# Patient Record
Sex: Male | Born: 1990 | Race: Black or African American | Hispanic: No | Marital: Married | Smoking: Never smoker
Health system: Southern US, Community
[De-identification: ages and names within clinical notes are randomized; demographics above are authoritative.]

## PROBLEM LIST (undated history)

## (undated) DIAGNOSIS — J45909 Unspecified asthma, uncomplicated: Secondary | ICD-10-CM

## (undated) DIAGNOSIS — I10 Essential (primary) hypertension: Secondary | ICD-10-CM

## (undated) DIAGNOSIS — L309 Dermatitis, unspecified: Secondary | ICD-10-CM

---
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
## 2010-05-15 ENCOUNTER — Emergency Department (HOSPITAL_COMMUNITY)
Admission: EM | Admit: 2010-05-15 | Discharge: 2010-05-15 | Payer: Self-pay | Source: Home / Self Care | Admitting: Emergency Medicine

## 2010-08-07 LAB — CBC
HCT: 45 % (ref 39.0–52.0)
Hemoglobin: 16.4 g/dL (ref 13.0–17.0)
MCH: 29.8 pg (ref 26.0–34.0)
MCHC: 36.4 g/dL — ABNORMAL HIGH (ref 30.0–36.0)
MCV: 81.8 fL (ref 78.0–100.0)
Platelets: 176 10*3/uL (ref 150–400)
RBC: 5.5 MIL/uL (ref 4.22–5.81)
RDW: 11.7 % (ref 11.5–15.5)
WBC: 5.3 10*3/uL (ref 4.0–10.5)

## 2010-08-07 LAB — URINALYSIS, ROUTINE W REFLEX MICROSCOPIC
Bilirubin Urine: NEGATIVE
Glucose, UA: NEGATIVE mg/dL
Hgb urine dipstick: NEGATIVE
Ketones, ur: NEGATIVE mg/dL
Nitrite: NEGATIVE
Protein, ur: NEGATIVE mg/dL
Specific Gravity, Urine: 1.017 (ref 1.005–1.030)
Urobilinogen, UA: 0.2 mg/dL (ref 0.0–1.0)
pH: 7 (ref 5.0–8.0)

## 2010-08-07 LAB — BASIC METABOLIC PANEL
BUN: 5 mg/dL — ABNORMAL LOW (ref 6–23)
CO2: 30 mEq/L (ref 19–32)
Calcium: 9.6 mg/dL (ref 8.4–10.5)
Chloride: 104 mEq/L (ref 96–112)
Creatinine, Ser: 0.98 mg/dL (ref 0.4–1.5)
GFR calc Af Amer: >60 mL/min (ref >60)
GFR calc non Af Amer: >60 mL/min (ref >60)
Glucose, Bld: 89 mg/dL (ref 70–99)
Potassium: 4.1 mEq/L (ref 3.5–5.1)
Sodium: 140 mEq/L (ref 135–145)

## 2010-08-07 LAB — HEPATIC FUNCTION PANEL
ALT: 17 U/L (ref 0–53)
AST: 21 U/L (ref 0–37)
Albumin: 4 g/dL (ref 3.5–5.2)
Alkaline Phosphatase: 63 U/L (ref 39–117)
Bilirubin, Direct: 0.2 mg/dL (ref 0.0–0.3)
Indirect Bilirubin: 1 mg/dL — ABNORMAL HIGH (ref 0.3–0.9)
Total Bilirubin: 1.2 mg/dL (ref 0.3–1.2)
Total Protein: 6.6 g/dL (ref 6.0–8.3)

## 2010-08-07 LAB — DIFFERENTIAL
Basophils Absolute: 0 10*3/uL (ref 0.0–0.1)
Basophils Relative: 0 % (ref 0–1)
Eosinophils Absolute: 0.1 10*3/uL (ref 0.0–0.7)
Eosinophils Relative: 2 % (ref 0–5)
Lymphocytes Relative: 22 % (ref 12–46)
Lymphs Abs: 1.2 10*3/uL (ref 0.7–4.0)
Monocytes Absolute: 0.4 10*3/uL (ref 0.1–1.0)
Monocytes Relative: 7 % (ref 3–12)
Neutro Abs: 3.7 10*3/uL (ref 1.7–7.7)
Neutrophils Relative %: 69 % (ref 43–77)

## 2011-03-20 ENCOUNTER — Emergency Department (HOSPITAL_COMMUNITY): Payer: Self-pay

## 2011-03-20 ENCOUNTER — Emergency Department (HOSPITAL_COMMUNITY)
Admission: EM | Admit: 2011-03-20 | Discharge: 2011-03-20 | Disposition: A | Discharge status: Home / Self Care | Payer: Self-pay | Attending: Emergency Medicine | Admitting: Emergency Medicine

## 2011-03-20 DIAGNOSIS — S60229A Contusion of unspecified hand, initial encounter: Secondary | ICD-10-CM | POA: Insufficient documentation

## 2011-03-20 DIAGNOSIS — M79609 Pain in unspecified limb: Secondary | ICD-10-CM | POA: Insufficient documentation

## 2011-03-20 DIAGNOSIS — IMO0002 Reserved for concepts with insufficient information to code with codable children: Secondary | ICD-10-CM | POA: Insufficient documentation

## 2012-06-03 ENCOUNTER — Emergency Department (HOSPITAL_COMMUNITY)
Admission: EM | Admit: 2012-06-03 | Discharge: 2012-06-03 | Disposition: A | Discharge status: Home / Self Care | Payer: Self-pay | Attending: Emergency Medicine | Admitting: Emergency Medicine

## 2012-06-03 ENCOUNTER — Encounter (HOSPITAL_COMMUNITY): Payer: Self-pay | Admitting: Emergency Medicine

## 2012-06-03 DIAGNOSIS — S71009A Unspecified open wound, unspecified hip, initial encounter: Secondary | ICD-10-CM | POA: Insufficient documentation

## 2012-06-03 DIAGNOSIS — J45909 Unspecified asthma, uncomplicated: Secondary | ICD-10-CM | POA: Insufficient documentation

## 2012-06-03 DIAGNOSIS — S71109A Unspecified open wound, unspecified thigh, initial encounter: Secondary | ICD-10-CM | POA: Insufficient documentation

## 2012-06-03 DIAGNOSIS — Z23 Encounter for immunization: Secondary | ICD-10-CM | POA: Insufficient documentation

## 2012-06-03 DIAGNOSIS — S71119A Laceration without foreign body, unspecified thigh, initial encounter: Secondary | ICD-10-CM

## 2012-06-03 DIAGNOSIS — W268XXA Contact with other sharp object(s), not elsewhere classified, initial encounter: Secondary | ICD-10-CM | POA: Insufficient documentation

## 2012-06-03 DIAGNOSIS — Y9389 Activity, other specified: Secondary | ICD-10-CM | POA: Insufficient documentation

## 2012-06-03 DIAGNOSIS — Y929 Unspecified place or not applicable: Secondary | ICD-10-CM | POA: Insufficient documentation

## 2012-06-03 HISTORY — DX: Unspecified asthma, uncomplicated: J45.909

## 2012-06-03 MED ORDER — TETANUS-DIPHTH-ACELL PERTUSSIS 5-2.5-18.5 LF-MCG/0.5 IM SUSP
0.5000 mL | Freq: Once | INTRAMUSCULAR | Status: AC
  Administered 2012-06-03: 0.5 mL via INTRAMUSCULAR
  Filled 2012-06-03: qty 0.5
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
## 2012-06-03 NOTE — ED Provider Notes (Signed)
Medical screening examination/treatment/procedure(s) were performed by non-physician practitioner and as supervising physician I was immediately available for consultation/collaboration.  Gerhard Munch, MD 06/03/12 2358

## 2012-06-03 NOTE — ED Provider Notes (Signed)
History     CSN: [id number]  Arrival date & time 06/03/12  4098   First MD Initiated Contact with Patient 06/03/12 1942      Chief Complaint  Patient presents with  . Extremity Laceration    (Consider location/radiation/quality/duration/timing/severity/associated sxs/prior treatment) Patient is a 22 y.o. male presenting with skin laceration. The history is provided by the patient. No language interpreter was used.  Laceration  The incident occurred yesterday. The laceration is located on the left leg. The laceration is 1 cm in size. The laceration mechanism was a a metal edge. The pain is at a severity of 0/10. The patient is experiencing no pain. He reports no foreign bodies present. His tetanus status is unknown.    Past Medical History  Diagnosis Date  . Asthma     History reviewed. No pertinent past surgical history.  History reviewed. No pertinent family history.  History  Substance Use Topics  . Smoking status: Never Smoker   . Smokeless tobacco: Not on file  . Alcohol Use: Yes     Comment: occ      Review of Systems  Skin: Positive for wound.  All other systems reviewed and are negative.    Allergies  Amoxicillin  Home Medications  No current outpatient prescriptions on file.  BP 95/62  Pulse 58  Temp 97.7 F (36.5 C) (Oral)  Resp 18  SpO2 100%  Physical Exam  Nursing note and vitals reviewed. Constitutional: He is oriented to person, place, and time. He appears well-developed and well-nourished.  HENT:  Head: Normocephalic and atraumatic.  Eyes: Conjunctivae normal are normal. Pupils are equal, round, and reactive to light.  Neck: Normal range of motion. Neck supple.  Cardiovascular: Normal rate and regular rhythm.   Pulmonary/Chest: Effort normal and breath sounds normal.  Abdominal: Soft. Bowel sounds are normal.  Musculoskeletal: Normal range of motion.  Lymphadenopathy:    He has no cervical adenopathy.  Neurological: He is alert and  oriented to person, place, and time.  Skin: Skin is warm and dry.     Psychiatric: He has a normal mood and affect. His behavior is normal. Judgment and thought content normal.    ED Course  Procedures (including critical care time)  Labs Reviewed - No data to display No results found.   No diagnosis found.   Laceration to thigh.  Over 24 hours old, superficial, no muscle or fascia involvement.  Will update tetanus, allow wound to heal by secondary intention. MDM          Jimmye Norman, NP 06/03/12 2312

## 2014-03-18 ENCOUNTER — Emergency Department (HOSPITAL_COMMUNITY)
Admission: EM | Admit: 2014-03-18 | Discharge: 2014-03-18 | Disposition: A | Discharge status: Home / Self Care | Payer: PRIVATE HEALTH INSURANCE | Attending: Emergency Medicine | Admitting: Emergency Medicine

## 2014-03-18 ENCOUNTER — Emergency Department (INDEPENDENT_AMBULATORY_CARE_PROVIDER_SITE_OTHER)
Admission: EM | Admit: 2014-03-18 | Discharge: 2014-03-18 | Disposition: A | Discharge status: Nursing Home (Includes ICF) | Payer: Self-pay | Source: Home / Self Care | Attending: Family Medicine | Admitting: Family Medicine

## 2014-03-18 ENCOUNTER — Encounter (HOSPITAL_COMMUNITY): Payer: Self-pay | Admitting: Emergency Medicine

## 2014-03-18 DIAGNOSIS — Z880 Allergy status to penicillin: Secondary | ICD-10-CM | POA: Insufficient documentation

## 2014-03-18 DIAGNOSIS — J029 Acute pharyngitis, unspecified: Secondary | ICD-10-CM

## 2014-03-18 DIAGNOSIS — K122 Cellulitis and abscess of mouth: Secondary | ICD-10-CM

## 2014-03-18 DIAGNOSIS — J45909 Unspecified asthma, uncomplicated: Secondary | ICD-10-CM | POA: Insufficient documentation

## 2014-03-18 DIAGNOSIS — R220 Localized swelling, mass and lump, head: Secondary | ICD-10-CM | POA: Insufficient documentation

## 2014-03-18 LAB — RAPID STREP SCREEN (MED CTR MEBANE ONLY): Streptococcus, Group A Screen (Direct): NEGATIVE

## 2014-03-18 MED ORDER — DEXAMETHASONE 4 MG PO TABS
10.0000 mg | ORAL_TABLET | Freq: Once | ORAL | Status: AC
  Administered 2014-03-18: 10 mg via ORAL
  Filled 2014-03-18: qty 3

## 2014-03-18 NOTE — ED Provider Notes (Signed)
CSN: [id number]     Arrival date & time 03/18/14  1101 History   First MD Initiated Contact with Patient 03/18/14 1249     Chief Complaint  Patient presents with  . Oral Swelling  . Sore Throat     (Consider location/radiation/quality/duration/timing/severity/associated sxs/prior Treatment) Patient is a 23 y.o. male presenting with general illness.  Illness Location:  L submandibular Quality:  Swelling Severity:  Moderate Onset quality:  Gradual Duration:  2 days Timing:  Constant Progression:  Worsening Chronicity:  New Context:  Spontaneous, no sick contacts, has had sore throat and malaise Relieved by:  Nothing Worsened by:  Swallowing, talking Associated symptoms: sore throat   Associated symptoms: no cough, no fever, no myalgias, no nausea and no vomiting     Past Medical History  Diagnosis Date  . Asthma    History reviewed. No pertinent past surgical history. History reviewed. No pertinent family history. History  Substance Use Topics  . Smoking status: Never Smoker   . Smokeless tobacco: Not on file  . Alcohol Use: Yes     Comment: occ    Review of Systems  Constitutional: Negative for fever.  HENT: Positive for sore throat.   Respiratory: Negative for cough.   Gastrointestinal: Negative for nausea and vomiting.  Musculoskeletal: Negative for myalgias.  All other systems reviewed and are negative.     Allergies  Amoxicillin  Home Medications   Prior to Admission medications   Not on File   BP 127/65  Pulse 58  Temp(Src) 98.6 F (37 C) (Oral)  Resp 18  SpO2 99% Physical Exam  Vitals reviewed. Constitutional: He is oriented to person, place, and time. He appears well-developed and well-nourished.  HENT:  Head: Normocephalic and atraumatic.  Mouth/Throat: Oropharynx is clear and moist.  Eyes: Conjunctivae and EOM are normal.  Neck: Normal range of motion. Neck supple.  Cardiovascular: Normal rate, regular rhythm and normal heart sounds.    Pulmonary/Chest: Effort normal and breath sounds normal. No respiratory distress.  Abdominal: He exhibits no distension. There is no tenderness. There is no rebound and no guarding.  Musculoskeletal: Normal range of motion.  Lymphadenopathy:    He has cervical adenopathy (bil).  Neurological: He is alert and oriented to person, place, and time.  Skin: Skin is warm and dry.    ED Course  Procedures (including critical care time) Labs Review Labs Reviewed - No data to display  Imaging Review No results found.   EKG Interpretation None      MDM   Final diagnoses:  None    23 y.o. male without pertinent PMH presents with L submandibular pain, swelling, and sore throat x 2 days.  He presents from urgent care due to concern for sublingual swelling and possible ludwigs.  On arrival here pt has an unremarkable floor of his mouth without swelling, clear oropharynx, but tender bil cervical adenopathy.  Strep screen negative.  Monitored pt for approximately 1 hour without change in exam which is unremarkable at this time for signs of ludwigs.  Suspect viral pharyngitis.  Discussed strict return precautions with patient and family extensively given prior provider concern for Ludwig angina and encouraged him to return if he had any swelling of the floor of his mouth. They voiced understanding, agreed with plan, and appeared happy with disposition.  1. Pharyngitis         Mirian MoMatthew Raahi Korber, MD 03/19/14 [phone]

## 2014-03-18 NOTE — ED Notes (Signed)
Reports swelling under tongue and having trouble swallowing.  Denies fever, n/v/d.  Symptoms present x 3 days.

## 2014-03-18 NOTE — ED Provider Notes (Signed)
Billy NobleChristopher C Perry is a 23 y.o. male who presents to Urgent Care today for jaw pain and swelling. Patient has a three-day history of mild pain and swelling under the left side of his mandible. It has been rapidly worsening since yesterday evening. He notes a voice change and trouble swallowing more than liquids. He has pain with opening his mouth. He denies any fevers nausea or vomiting. He feels well otherwise.   Past Medical History  Diagnosis Date  . Asthma    History  Substance Use Topics  . Smoking status: Never Smoker   . Smokeless tobacco: Not on file  . Alcohol Use: Yes     Comment: occ   ROS as above Medications: No current facility-administered medications for this encounter.   No current outpatient prescriptions on file.    Exam:  BP 139/74  Pulse 60  Temp(Src) 98.6 F (37 C) (Oral)  Resp 12  SpO2 99% Gen: Well NAD HEENT: EOMI,  MMM tender induration present on the left side of the mandible. No visible abscess noted inside of the mouth. No significant poor dentition noted. Posterior pharynx appears to be normal. Possible tongue elevation present. Lungs: Normal work of breathing. CTABL Heart: RRR no MRG Abd: NABS, Soft. Nondistended, Nontender Exts: Brisk capillary refill, warm and well perfused.   No results found for this or any previous visit (from the past 24 hour(s)). No results found.  Assessment and Plan: 23 y.o. male with pain and swelling at the left mandible. This is somewhat concerning for Ludwig's angina. Patient notes some trismus associated with muffled voice and induration along the area inferior to the mandible. Recommend transfer to the emergency room for further evaluation and management of this problem. Patient will go via shuttle.  Discussed warning signs or symptoms. Please see discharge instructions. Patient expresses understanding.     Rodolph BongEvan S Sahily Biddle, MD 03/18/14 1048

## 2014-03-18 NOTE — ED Notes (Signed)
[facility]; pt sts sent for CT scan; no distress noted

## 2014-03-18 NOTE — Discharge Instructions (Signed)

## 2014-03-20 LAB — CULTURE, GROUP A STREP

## 2014-03-22 ENCOUNTER — Emergency Department (INDEPENDENT_AMBULATORY_CARE_PROVIDER_SITE_OTHER)
Admission: EM | Admit: 2014-03-22 | Discharge: 2014-03-22 | Disposition: A | Discharge status: Home / Self Care | Payer: Self-pay | Source: Home / Self Care

## 2014-03-22 ENCOUNTER — Encounter (HOSPITAL_COMMUNITY): Payer: Self-pay | Admitting: Emergency Medicine

## 2014-03-22 DIAGNOSIS — J029 Acute pharyngitis, unspecified: Secondary | ICD-10-CM

## 2014-03-22 DIAGNOSIS — K140 Glossitis: Secondary | ICD-10-CM

## 2014-03-22 DIAGNOSIS — K121 Other forms of stomatitis: Secondary | ICD-10-CM

## 2014-03-22 MED ORDER — CLINDAMYCIN HCL 300 MG PO CAPS: 300.0000 mg | ORAL_CAPSULE | Freq: Three times a day (TID) | ORAL | Status: DC

## 2014-03-22 NOTE — ED Notes (Signed)
Pt is here today because he feels like his tongue has been swelling, he said that its been that way for a week and he can only eat soup, pt was seen at the ED on 10/22 and was diagnosed with pharyngitis

## 2014-03-22 NOTE — ED Provider Notes (Signed)
CSN: [id number]     Arrival date & time 03/22/14  1131 History   First MD Initiated Contact with Patient 03/22/14 1344     Chief Complaint  Patient presents with  . Oral Swelling   (Consider location/radiation/quality/duration/timing/severity/associated sxs/prior Treatment) HPI Comments: 23 year old male presents approximate 4 day after evaluation in both the [facility] and ED for swelling in the mouth and tongue. The concern initially was that of lead weeks angina. He was treated with steroids in the emergency department and discharged. Today the patient returns with sensation of tongue swelling, some burning discomfort to the surface of the time as well as pain with swallowing. Denies any known fever. No fatigue. Possibly mild malaise. His strep test was negative   Past Medical History  Diagnosis Date  . Asthma    History reviewed. No pertinent past surgical history. History reviewed. No pertinent family history. History  Substance Use Topics  . Smoking status: Never Smoker   . Smokeless tobacco: Not on file  . Alcohol Use: Yes     Comment: occ    Review of Systems  Constitutional: Negative for fever, chills and appetite change.  HENT: Positive for mouth sores, sore throat, trouble swallowing and voice change. Negative for congestion, postnasal drip and sinus pressure.   Eyes: Negative.   Respiratory: Negative.   Cardiovascular: Negative for chest pain.  Gastrointestinal: Negative.   Skin: Negative.   Neurological: Negative.     Allergies  Amoxicillin  Home Medications   Prior to Admission medications   Medication Sig Start Date End Date Taking? Authorizing Provider  clindamycin (CLEOCIN) 300 MG capsule Take 1 capsule (300 mg total) by mouth 3 (three) times daily. 03/22/14   Hayden Rasmussenavid Kaeden Depaz, NP   BP 133/78  Pulse 63  Temp(Src) 99.2 F (37.3 C) (Oral)  Resp 16  SpO2 97% Physical Exam  Nursing note and vitals reviewed. Constitutional: He is oriented to person,  place, and time. He appears well-developed and well-nourished. No distress.  HENT:  Mouth/Throat: No oropharyngeal exudate.  Oropharynx without evidence of buccal erythema or swelling, swelling, or erythema sublingual. No dental tenderness. There is mild erythema of the oropharynx. Airway is widely patent. No exudates. The surface of the tongue is discolored with a central pale white  patch which is surrounded by pinpoint red lesions. No dental tenderness, gingival redness or swelling. No signs of abscess.    Eyes: Conjunctivae and EOM are normal.  Neck: Normal range of motion. Neck supple. No tracheal deviation present.  No swelling of the floor of the mouth or the submental/submandibular areas. No indurations. No lymphadenopathy.  Cardiovascular: Normal rate.   Pulmonary/Chest: Effort normal and breath sounds normal. No respiratory distress.  Musculoskeletal: He exhibits no edema.  Lymphadenopathy:    He has no cervical adenopathy.  Neurological: He is alert and oriented to person, place, and time. He exhibits normal muscle tone.  Skin: Skin is warm.  Psychiatric: He has a normal mood and affect.    ED Course  Procedures (including critical care time) Labs Review Labs Reviewed - No data to display  Imaging Review No results found.   MDM   1. Pharyngitis   2. Tongue infection   3. Stomatitis    No evidence of Ludwigs angina Suspect a type of glossitis combined with pharyngitis Magic mouth wash Clindamycin        Hayden Rasmussenavid Airyonna Franklyn, NP 03/22/14 1436

## 2014-03-22 NOTE — Discharge Instructions (Signed)
Glossitis Glossitis is an inflammation of the tongue. Changes in the appearance of the tongue may be a primary tongue disorder. This means the problem is only in the tongue. Glossitis may be a symptom of other disorders. CAUSES   Excessive alcohol.  Multiple allergies.  Infections.  Tobacco and nicotine use.  Anemia.  Mechanical injury.  Spicy foods.  Vitamin B deficiency.  Damage from chemicals or hot food or drink. SYMPTOMS  There may be swelling and color changes in the tongue. Sometimes the surface of the tongue may look smooth. This disorder may be painless. But the tongue is usually sore and tender. It can be fiery red if condition is caused by deficiency of B vitamins. It is sometimes pale if there is anemia. Anemia means there are not enough red blood cells. There may be problems with chewing, swallowing and speaking. In some cases, glossitis may result in severe tongue swelling that blocks the airway. DIAGNOSIS  The diagnosis of glossitis is made easily by physical exam and asking for a history. Sometimes blood tests may be done. TREATMENT   The goal of treatment is to reduce inflammation. Hospitalization is usually not necessary unless tongue swelling is severe.  Good oral hygiene is important. This means good tooth brushing at least twice a day, and flossing daily for treatment and prevention.  Corticosteroids such as prednisone may be given to reduce the redness and soreness.  Medications may be prescribed if the cause of glossitis is an infection.  Other problems such as anemia and nutritional deficiencies are treated. This may be a dietary change or vitamin supplements. Avoid hot or spicy foods, alcohol, and tobacco. This lessens the discomfort.  Avoid anything that is irritating to your mouth or tongue. Glossitis usually responds well to treatment if the cause of it is removed or treated.  SEEK IMMEDIATE MEDICAL CARE IF:   Symptoms of glossitis persist for  longer than 10 days.  Tongue swelling is severe and breathing, speaking, chewing, or swallowing difficulties are present.  You have no relief from medications given.  You develop difficulties breathing. Document Released: 05/04/2002 Document Revised: 08/06/2011 Document Reviewed: 06/18/2008 [facility] Patient Information 2015 [place], [place]. This information is not intended to replace advice given to you by your health care provider. Make sure you discuss any questions you have with your health care provider.  Stomatitis Stomatitis is an inflammation of the mucous lining of the mouth. It can affect part of the mouth or the whole mouth. The intensity of symptoms can range from mild to severe. It can affect your cheek, teeth, gums, lips, or tongue. In almost all cases, the lining of the mouth becomes swollen, red, and painful. Painful ulcers can develop in your mouth. Stomatitis recurs in some people. CAUSES  There are many common causes of stomatitis. They include:  Viruses (such as cold sores or shingles).  Canker sores.  Bacteria (such as ulcerative gingivitis or sexually transmitted diseases).  Fungus or yeast (such as candidiasis or oral thrush).  Poor oral hygiene and poor nutrition (Vincent's stomatitis or trench mouth).  Lack of vitamin B, vitamin C, or niacin.  Dentures or braces that do not fit properly.  High acid foods (uncommon).  Sharp or broken teeth.  Cheek biting.  Breathing through the mouth.  Chewing tobacco.  Allergy to toothpaste, mouthwash, candy, gum, lipstick, or some medicines.  Burning your mouth with hot drinks or food.  Exposure to dyes, heavy metals, acid fumes, or mineral dust. SYMPTOMS  Painful ulcers in the mouth.  Blisters in the mouth.  Bleeding gums.  Swollen gums.  Irritability.  Bad breath.  Bad taste in the mouth.  Fever.  Trouble eating because of burning and pain in the mouth. DIAGNOSIS  Your caregiver will examine  your mouth and look for bleeding gums and mouth ulcers. Your caregiver may ask you about the medicines you are taking. Your caregiver may suggest a blood test and tissue sample (biopsy) of the mouth ulcer or mass if either is present. This will help find the cause of your condition. TREATMENT  Your treatment will depend on the cause of your condition. Your caregiver will first try to treat your symptoms.   You may be given pain medicine. Topical anesthetic may be used to numb the area if you have severe pain.  Your caregiver may prescribe antibiotic medicine if you have a bacterial infection.  Your caregiver may prescribe antifungal medicine if you have a fungal infection.  You may need to take antiviral medicine if you have a viral infection like herpes.  You may be asked to use medicated mouth rinses.  Your caregiver will advise you about proper brushing and using a soft toothbrush. You also need to get your teeth cleaned regularly. HOME CARE INSTRUCTIONS   Maintain good oral hygiene. This is especially important for transplant patients.  Brush your teeth carefully with a soft, nylon-bristled toothbrush.  Floss at least 2 times a day.  Clean your mouth after eating.  Rinse your mouth with salt water 3 to 4 times a day.  Gargle with cold water.  Use topical numbing medicines to decrease pain if recommended by your caregiver.  Stop smoking, and stop using chewing or smokeless tobacco.  Avoid eating hot and spicy foods.  Eat soft and bland food.  Reduce your stress wherever possible.  Eat healthy and nutritious foods. SEEK MEDICAL CARE IF:   Your symptoms persist or get worse.  You develop new symptoms.  Your mouth ulcers are present for more than 3 weeks.  Your mouth ulcers come back frequently.  You have increasing difficulty with normal eating and drinking.  You have increasing fatigue or weakness.  You develop loss of appetite or nausea. SEEK IMMEDIATE  MEDICAL CARE IF:   You have a fever.  You develop pain, redness, or sores around one or both eyes.  You cannot eat or drink because of pain or other symptoms.  You develop worsening weakness, or you faint.  You develop vomiting or diarrhea.  You develop chest pain, shortness of breath, or rapid and irregular heartbeats. MAKE SURE YOU:  Understand these instructions.  Will watch your condition.  Will get help right away if you are not doing well or get worse. Document Released: 03/11/2007 Document Revised: 08/06/2011 Document Reviewed: 12/21/2010 [facility] Patient Information 2015 [place], [place]. This information is not intended to replace advice given to you by your health care provider. Make sure you discuss any questions you have with your health care provider.

## 2014-03-23 ENCOUNTER — Telehealth (HOSPITAL_COMMUNITY): Payer: Self-pay | Admitting: *Deleted

## 2014-03-23 NOTE — ED Provider Notes (Signed)
Medical screening examination/treatment/procedure(s) were performed by non-physician practitioner and as supervising physician I was immediately available for consultation/collaboration.  Allesandra Huebsch, M.D.  Grainger Mccarley C Svetlana Bagby, MD 03/23/14 0814 

## 2014-03-23 NOTE — ED Notes (Signed)
Pt.'s Mom called and said he needs a work note.  I told her I would call him back when it was ready. Discussed with Dr. Lorenz CoasterKeller and he did one to return on 10/29.  Pt. started Clindamycin on 10/26 and is still having a hard time swallowing. Vassie MoselleYork, Petronella Shuford M 03/23/2014

## 2014-03-28 ENCOUNTER — Other Ambulatory Visit (HOSPITAL_COMMUNITY)
Admission: RE | Admit: 2014-03-28 | Discharge: 2014-03-28 | Disposition: A | Discharge status: Home / Self Care | Payer: PRIVATE HEALTH INSURANCE | Source: Ambulatory Visit | Attending: Emergency Medicine | Admitting: Emergency Medicine

## 2014-03-28 ENCOUNTER — Encounter (HOSPITAL_COMMUNITY): Payer: Self-pay

## 2014-03-28 ENCOUNTER — Emergency Department (INDEPENDENT_AMBULATORY_CARE_PROVIDER_SITE_OTHER)
Admission: EM | Admit: 2014-03-28 | Discharge: 2014-03-28 | Disposition: A | Discharge status: Home / Self Care | Payer: PRIVATE HEALTH INSURANCE | Source: Home / Self Care | Attending: Emergency Medicine | Admitting: Emergency Medicine

## 2014-03-28 DIAGNOSIS — Z113 Encounter for screening for infections with a predominantly sexual mode of transmission: Secondary | ICD-10-CM | POA: Insufficient documentation

## 2014-03-28 DIAGNOSIS — N342 Other urethritis: Secondary | ICD-10-CM | POA: Diagnosis not present

## 2014-03-28 LAB — HIV ANTIBODY (ROUTINE TESTING W REFLEX): HIV 1&2 Ab, 4th Generation: NONREACTIVE

## 2014-03-28 LAB — RPR

## 2014-03-28 MED ORDER — AZITHROMYCIN 250 MG PO TABS
1000.0000 mg | ORAL_TABLET | Freq: Once | ORAL | Status: AC
  Administered 2014-03-28: 1000 mg via ORAL

## 2014-03-28 MED ORDER — CEFTRIAXONE SODIUM 250 MG IJ SOLR
250.0000 mg | Freq: Once | INTRAMUSCULAR | Status: AC
  Administered 2014-03-28: 250 mg via INTRAMUSCULAR

## 2014-03-28 MED ORDER — CEFTRIAXONE SODIUM 250 MG IJ SOLR
INTRAMUSCULAR | Status: AC
  Filled 2014-03-28: qty 250

## 2014-03-28 MED ORDER — AZITHROMYCIN 250 MG PO TABS
ORAL_TABLET | ORAL | Status: AC
  Filled 2014-03-28: qty 4

## 2014-03-28 MED ORDER — LIDOCAINE HCL (PF) 1 % IJ SOLN
INTRAMUSCULAR | Status: AC
  Filled 2014-03-28: qty 5

## 2014-03-28 NOTE — ED Provider Notes (Signed)
CSN: [id number]     Arrival date & time 03/28/14  0940 History   First MD Initiated Contact with Patient 03/28/14 [phone]     Chief Complaint  Patient presents with  . 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 male presenting with dysuria. The history is provided by the patient.  Dysuria This is a new problem. The current episode started yesterday. The problem occurs constantly. The problem has not changed since onset.   Past Medical History  Diagnosis Date  . Asthma    History reviewed. No pertinent past surgical history. History reviewed. No pertinent family history. History  Substance Use Topics  . Smoking status: Never Smoker   . Smokeless tobacco: Not on file  . Alcohol Use: Yes     Comment: occ    Review of Systems  Constitutional: Negative.   HENT: Negative.   Eyes: Negative.   Respiratory: Negative.   Cardiovascular: Negative.   Gastrointestinal: Negative.   Genitourinary: Positive for dysuria. Negative for frequency, hematuria, flank pain, decreased urine volume, discharge, penile swelling, scrotal swelling, difficulty urinating, genital sores, penile pain and testicular pain.  Musculoskeletal: Negative.   Skin: Negative.   Allergic/Immunologic: Negative for immunocompromised state.    Allergies  Amoxicillin  Home Medications   Prior to Admission medications   Medication Sig Start Date End Date Taking? Authorizing Provider  clindamycin (CLEOCIN) 300 MG capsule Take 1 capsule (300 mg total) by mouth 3 (three) times daily. 03/22/14   Hayden Rasmussenavid Mabe, NP   BP 145/83 mmHg  Pulse 73  Temp(Src) 98.3 F (36.8 C) (Oral)  Resp 18  SpO2 100% Physical Exam  Constitutional: He is oriented to person, place, and time. He appears  well-developed and well-nourished. No distress.  HENT:  Head: Normocephalic and atraumatic.  Eyes: Conjunctivae are normal. No scleral icterus.  Neck: Normal range of motion. Neck supple.  Cardiovascular: Normal rate, regular rhythm and normal heart sounds.   Pulmonary/Chest: Effort normal and breath sounds normal.  Abdominal: Soft. Bowel sounds are normal. He exhibits no distension. There is no tenderness.  Genitourinary: Testes normal and penis normal. Right testis shows no mass, no swelling and no tenderness. Left testis shows no mass, no swelling and no tenderness. Circumcised. No penile erythema or penile tenderness. No discharge found.  Musculoskeletal: Normal range of motion.  Neurological: He is alert and oriented to person, place, and time.  Skin: Skin is warm and dry. No rash noted. No erythema.  Psychiatric: He has a normal mood and affect. His behavior is normal.  Nursing note and vitals reviewed.   ED Course  Procedures (including critical care time) Labs Review Labs Reviewed  URINE CULTURE  HIV ANTIBODY (ROUTINE TESTING)  RPR  URINE CYTOLOGY ANCILLARY ONLY    Imaging Review No results found.   MDM   1. Urethritis    Urine sent for cytology and RPR and HIV testing also sent, Urine sent for C&S. Will treat empirically for gonorrhea and chlamydia with azithromycin 1 gram po and ceftriaxone 250 mg IM [facility] and will notify patient if results indicate the need for additional testing    Ria ClockJennifer Lee H Etta Gassett, PA 03/28/14 1017

## 2014-03-28 NOTE — Discharge Instructions (Signed)
Urethritis °Urethritis is an inflammation of the tube through which urine exits your bladder (urethra).  °CAUSES °Urethritis is often caused by an infection in your urethra. The infection can be viral, like herpes. The infection can also be bacterial, like gonorrhea. °RISK FACTORS °Risk factors of urethritis include: °· Having sex without using a condom. °· Having multiple sexual partners. °· Having poor hygiene. °SIGNS AND SYMPTOMS °Symptoms of urethritis are less noticeable in women than in men. These symptoms include: °· Burning feeling when you urinate (dysuria). °· Discharge from your urethra. °· Blood in your urine (hematuria). °· Urinating more than usual. °DIAGNOSIS  °To confirm a diagnosis of urethritis, your health care provider will do the following: °· Ask about your sexual history. °· Perform a physical exam. °· Have you provide a sample of your urine for lab testing. °· Use a cotton swab to gently collect a sample from your urethra for lab testing. °TREATMENT  °It is important to treat urethritis. Depending on the cause, untreated urethritis may lead to serious genital infections and possibly infertility. Urethritis caused by a bacterial infection is treated with antibiotic medicine. All sexual partners must be treated.  °HOME CARE INSTRUCTIONS °· Do not have sex until the test results are known and treatment is completed, even if your symptoms go away before you finish treatment. °· If you were prescribed an antibiotic, finish it all even if you start to feel better. °SEEK MEDICAL CARE IF:  °· Your symptoms are not improved in 3 days. °· Your symptoms are getting worse. °· You develop abdominal pain or pelvic pain (in women). °· You develop joint pain. °· You have a fever. °SEEK IMMEDIATE MEDICAL CARE IF:  °· You have severe pain in the belly, back, or side. °· You have repeated vomiting. °MAKE SURE YOU: °· Understand these instructions. °· Will watch your condition. °· Will get help right away if you  are not doing well or get worse. °Document Released: 11/07/2000 Document Revised: 09/28/2013 Document Reviewed: 01/12/2013 °[employer]® Patient Information ©2015 [employer]. This information is not intended to replace advice given to you by your health care provider. Make sure you discuss any questions you have with your health care provider. ° °

## 2014-03-28 NOTE — ED Notes (Addendum)
C/o burning w urination x 2-3 days, sexually active w/o condoms. Has returned to work since seen w tongue/ mouth/sub mandibular  issues. NAD . sipping on fluids w/o observable difficulty

## 2014-03-29 LAB — URINE CYTOLOGY ANCILLARY ONLY
Chlamydia: NEGATIVE
Neisseria Gonorrhea: NEGATIVE
Trichomonas: NEGATIVE

## 2014-03-30 LAB — URINE CULTURE
Colony Count: NO GROWTH
Culture: NO GROWTH
Special Requests: NORMAL

## 2015-03-04 ENCOUNTER — Encounter (HOSPITAL_COMMUNITY): Payer: Self-pay | Admitting: *Deleted

## 2015-03-04 ENCOUNTER — Emergency Department (HOSPITAL_COMMUNITY)
Admission: EM | Admit: 2015-03-04 | Discharge: 2015-03-04 | Disposition: A | Discharge status: Home / Self Care | Payer: PRIVATE HEALTH INSURANCE | Attending: Emergency Medicine | Admitting: Emergency Medicine

## 2015-03-04 ENCOUNTER — Emergency Department (HOSPITAL_COMMUNITY): Payer: PRIVATE HEALTH INSURANCE

## 2015-03-04 DIAGNOSIS — S39012A Strain of muscle, fascia and tendon of lower back, initial encounter: Secondary | ICD-10-CM | POA: Insufficient documentation

## 2015-03-04 DIAGNOSIS — S5012XA Contusion of left forearm, initial encounter: Secondary | ICD-10-CM | POA: Insufficient documentation

## 2015-03-04 DIAGNOSIS — Z792 Long term (current) use of antibiotics: Secondary | ICD-10-CM | POA: Insufficient documentation

## 2015-03-04 DIAGNOSIS — Y9389 Activity, other specified: Secondary | ICD-10-CM | POA: Insufficient documentation

## 2015-03-04 DIAGNOSIS — S50812A Abrasion of left forearm, initial encounter: Secondary | ICD-10-CM | POA: Insufficient documentation

## 2015-03-04 DIAGNOSIS — S3992XA Unspecified injury of lower back, initial encounter: Secondary | ICD-10-CM | POA: Diagnosis present

## 2015-03-04 DIAGNOSIS — Z791 Long term (current) use of non-steroidal anti-inflammatories (NSAID): Secondary | ICD-10-CM | POA: Insufficient documentation

## 2015-03-04 DIAGNOSIS — S6992XA Unspecified injury of left wrist, hand and finger(s), initial encounter: Secondary | ICD-10-CM | POA: Insufficient documentation

## 2015-03-04 DIAGNOSIS — Y999 Unspecified external cause status: Secondary | ICD-10-CM | POA: Diagnosis not present

## 2015-03-04 DIAGNOSIS — Z880 Allergy status to penicillin: Secondary | ICD-10-CM | POA: Diagnosis not present

## 2015-03-04 DIAGNOSIS — J45909 Unspecified asthma, uncomplicated: Secondary | ICD-10-CM | POA: Diagnosis not present

## 2015-03-04 DIAGNOSIS — Y92410 Unspecified street and highway as the place of occurrence of the external cause: Secondary | ICD-10-CM | POA: Diagnosis not present

## 2015-03-04 MED ORDER — CYCLOBENZAPRINE HCL 10 MG PO TABS: 10.0000 mg | ORAL_TABLET | Freq: Two times a day (BID) | ORAL | Status: DC | PRN

## 2015-03-04 MED ORDER — NAPROXEN 500 MG PO TABS: 500.0000 mg | ORAL_TABLET | Freq: Two times a day (BID) | ORAL | Status: DC
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
## 2015-03-18 ENCOUNTER — Other Ambulatory Visit: Payer: PRIVATE HEALTH INSURANCE | Admitting: Family Medicine

## 2015-03-18 ENCOUNTER — Other Ambulatory Visit (HOSPITAL_COMMUNITY)
Admission: RE | Admit: 2015-03-18 | Discharge: 2015-03-18 | Disposition: A | Discharge status: Home / Self Care | Payer: PRIVATE HEALTH INSURANCE | Source: Ambulatory Visit | Attending: Family Medicine | Admitting: Family Medicine

## 2015-03-18 DIAGNOSIS — Z113 Encounter for screening for infections with a predominantly sexual mode of transmission: Secondary | ICD-10-CM | POA: Diagnosis present

## 2015-03-23 LAB — URINE CYTOLOGY ANCILLARY ONLY
Bacterial vaginitis: NEGATIVE
Candida vaginitis: NEGATIVE
Chlamydia: NEGATIVE
Neisseria Gonorrhea: NEGATIVE
Trichomonas: NEGATIVE

## 2015-03-28 ENCOUNTER — Other Ambulatory Visit (HOSPITAL_COMMUNITY)
Admission: RE | Admit: 2015-03-28 | Discharge: 2015-03-28 | Disposition: A | Discharge status: Home / Self Care | Payer: PRIVATE HEALTH INSURANCE | Source: Ambulatory Visit | Attending: Family Medicine | Admitting: Family Medicine

## 2015-03-28 ENCOUNTER — Other Ambulatory Visit: Payer: PRIVATE HEALTH INSURANCE | Admitting: Family Medicine

## 2015-03-28 DIAGNOSIS — N760 Acute vaginitis: Secondary | ICD-10-CM | POA: Diagnosis present

## 2015-03-28 DIAGNOSIS — Z113 Encounter for screening for infections with a predominantly sexual mode of transmission: Secondary | ICD-10-CM | POA: Insufficient documentation

## 2015-04-01 LAB — URINE CYTOLOGY ANCILLARY ONLY
Bacterial vaginitis: NEGATIVE
Candida vaginitis: NEGATIVE
Chlamydia: NEGATIVE
Neisseria Gonorrhea: NEGATIVE
Trichomonas: NEGATIVE

## 2015-11-02 ENCOUNTER — Emergency Department (HOSPITAL_COMMUNITY)
Admission: EM | Admit: 2015-11-02 | Discharge: 2015-11-02 | Disposition: A | Discharge status: Home / Self Care | Payer: PRIVATE HEALTH INSURANCE | Attending: Emergency Medicine | Admitting: Emergency Medicine

## 2015-11-02 ENCOUNTER — Encounter (HOSPITAL_COMMUNITY): Payer: Self-pay | Admitting: *Deleted

## 2015-11-02 DIAGNOSIS — J45909 Unspecified asthma, uncomplicated: Secondary | ICD-10-CM | POA: Insufficient documentation

## 2015-11-02 DIAGNOSIS — Z791 Long term (current) use of non-steroidal anti-inflammatories (NSAID): Secondary | ICD-10-CM | POA: Insufficient documentation

## 2015-11-02 DIAGNOSIS — R197 Diarrhea, unspecified: Secondary | ICD-10-CM | POA: Insufficient documentation

## 2015-11-02 DIAGNOSIS — R509 Fever, unspecified: Secondary | ICD-10-CM | POA: Insufficient documentation

## 2015-11-02 DIAGNOSIS — Z792 Long term (current) use of antibiotics: Secondary | ICD-10-CM | POA: Insufficient documentation

## 2015-11-02 LAB — CBC
HCT: 43.5 % (ref 39.0–52.0)
Hemoglobin: 15.4 g/dL (ref 13.0–17.0)
MCH: 28.8 pg (ref 26.0–34.0)
MCHC: 35.4 g/dL (ref 30.0–36.0)
MCV: 81.3 fL (ref 78.0–100.0)
Platelets: 132 10*3/uL — ABNORMAL LOW (ref 150–400)
RBC: 5.35 MIL/uL (ref 4.22–5.81)
RDW: 11.8 % (ref 11.5–15.5)
WBC: 7.5 10*3/uL (ref 4.0–10.5)

## 2015-11-02 LAB — URINALYSIS, ROUTINE W REFLEX MICROSCOPIC
Bilirubin Urine: NEGATIVE
Glucose, UA: NEGATIVE mg/dL
Hgb urine dipstick: NEGATIVE
Ketones, ur: NEGATIVE mg/dL
Leukocytes, UA: NEGATIVE
Nitrite: NEGATIVE
Protein, ur: NEGATIVE mg/dL
Specific Gravity, Urine: 1.018 (ref 1.005–1.030)
pH: 7.5 (ref 5.0–8.0)

## 2015-11-02 LAB — COMPREHENSIVE METABOLIC PANEL
ALT: 57 U/L (ref 17–63)
AST: 38 U/L (ref 15–41)
Albumin: 4 g/dL (ref 3.5–5.0)
Alkaline Phosphatase: 63 U/L (ref 38–126)
Anion gap: 10 (ref 5–15)
BUN: 9 mg/dL (ref 6–20)
CO2: 25 mmol/L (ref 22–32)
Calcium: 9.3 mg/dL (ref 8.9–10.3)
Chloride: 100 mmol/L — ABNORMAL LOW (ref 101–111)
Creatinine, Ser: 1.02 mg/dL (ref 0.61–1.24)
GFR calc Af Amer: >60 mL/min (ref >60)
GFR calc non Af Amer: >60 mL/min (ref >60)
Glucose, Bld: 118 mg/dL — ABNORMAL HIGH (ref 65–99)
Potassium: 3.2 mmol/L — ABNORMAL LOW (ref 3.5–5.1)
Sodium: 135 mmol/L (ref 135–145)
Total Bilirubin: 1 mg/dL (ref 0.3–1.2)
Total Protein: 7.1 g/dL (ref 6.5–8.1)

## 2015-11-02 LAB — LIPASE, BLOOD: Lipase: 23 U/L (ref 11–51)

## 2015-11-02 MED ORDER — LOPERAMIDE HCL 2 MG PO CAPS: 2.0000 mg | ORAL_CAPSULE | Freq: Four times a day (QID) | ORAL | Status: DC | PRN

## 2015-11-02 MED ORDER — ONDANSETRON 4 MG PO TBDP: 4.0000 mg | ORAL_TABLET | Freq: Three times a day (TID) | ORAL | Status: DC | PRN

## 2015-11-02 MED ORDER — POTASSIUM CHLORIDE CRYS ER 20 MEQ PO TBCR
40.0000 meq | EXTENDED_RELEASE_TABLET | Freq: Once | ORAL | Status: AC
  Administered 2015-11-02: 40 meq via ORAL
  Filled 2015-11-02: qty 2

## 2015-11-02 MED ORDER — LOPERAMIDE HCL 2 MG PO CAPS
4.0000 mg | ORAL_CAPSULE | Freq: Once | ORAL | Status: AC
Start: 2015-11-02 — End: 2015-11-02
  Administered 2015-11-02: 4 mg via ORAL
  Filled 2015-11-02: qty 2

## 2015-11-02 NOTE — ED Provider Notes (Signed)
By signing my name below, I, Billy Perry, attest that this documentation has been prepared under the direction and in the presence of Enbridge EnergyKristen N Ward, DO. Electronically Signed: Evon Slackerrance Perry, ED Scribe. 11/02/2015. 3:29 AM.  TIME SEEN: 3:29 AM  CHIEF COMPLAINT: Diarrhea  HPI:  HPI Comments: Billy Perry is a 25 y.o. male with history of asthma who presents to the Emergency Department complaining of diarrhea x5 onset 1 day prior at 7 PM. Pt states that he has subjective fever and chills. Pt states that he feels as if he is dehydrated. Pt doesn't report any medications PTA. Pt doesn't report any alleviating or worsening factors. Denies dysuria, blood in stool, melena, vomiting or hematuria. Denies recent sick contacts. Denies recent hospitalizations or antibiotic use. Denies recent long distance travel or travel outside of the country. Denies any current nausea.  ROS: See HPI Constitutional: no fever  Eyes: no drainage  ENT: no runny nose   Cardiovascular:  no chest pain  Resp: no SOB  GI: no vomiting GU: no dysuria Integumentary: no rash  Allergy: no hives  Musculoskeletal: no leg swelling  Neurological: no slurred speech ROS otherwise negative  PAST MEDICAL HISTORY/PAST SURGICAL HISTORY:  Past Medical History  Diagnosis Date  . Asthma     MEDICATIONS:  Prior to Admission medications   Medication Sig Start Date End Date Taking? Authorizing Provider  clindamycin (CLEOCIN) 300 MG capsule Take 1 capsule (300 mg total) by mouth 3 (three) times daily. 03/22/14   Hayden Rasmussenavid Mabe, NP  cyclobenzaprine (FLEXERIL) 10 MG tablet Take 1 tablet (10 mg total) by mouth 2 (two) times daily as needed for muscle spasms. 03/04/15   Hope Orlene OchM Neese, NP  naproxen (NAPROSYN) 500 MG tablet Take 1 tablet (500 mg total) by mouth 2 (two) times daily. 03/04/15   Hope Orlene OchM Neese, NP    ALLERGIES:  Allergies  Allergen Reactions  . Amoxicillin     SOCIAL HISTORY:  Social History  Substance Use Topics   . Smoking status: Never Smoker   . Smokeless tobacco: Never Used  . Alcohol Use: Yes     Comment: occ    FAMILY HISTORY: No family history on file.  EXAM: BP 134/70 mmHg  Pulse 100  Temp(Src) 101.3 F (38.5 C) (Oral)  Resp 18  Ht 5\' 11"  (1.803 m)  Wt 235 lb 9 oz (106.85 kg)  BMI 32.87 kg/m2  SpO2 98%   CONSTITUTIONAL: Alert and oriented and responds appropriately to questions. Well-appearing; well-nourished HEAD: Normocephalic EYES: Conjunctivae clear, PERRL ENT: normal nose; no rhinorrhea; moist mucous membranes NECK: Supple, no meningismus, no LAD  CARD: RRR; S1 and S2 appreciated; no murmurs, no clicks, no rubs, no gallops RESP: Normal chest excursion without splinting or tachypnea; breath sounds clear and equal bilaterally; no wheezes, no rhonchi, no rales, no hypoxia or respiratory distress, speaking full sentences ABD/GI: Normal bowel sounds; non-distended; soft, non-tender, no rebound, no guarding, no peritoneal signs BACK:  The back appears normal and is non-tender to palpation, there is no CVA tenderness EXT: Normal ROM in all joints; non-tender to palpation; no edema; normal capillary refill; no cyanosis, no calf tenderness or swelling    SKIN: Normal color for age and race; warm; no rash NEURO: Moves all extremities equally, sensation to light touch intact diffusely, cranial nerves II through XII intact PSYCH: The patient's mood and manner are appropriate. Grooming and personal hygiene are appropriate.  MEDICAL DECISION MAKING: Patient here with diarrhea 5 episodes, fever. No abdominal pain.  Suspect viral illness. No sick contacts or recent travel. Recommend alternating Tylenol and Motrin for fever and pain. Recommended Imodium for diarrhea. No current nausea. We'll discharge with prescriptions for Imodium, Zofran. Abdominal exam is completely benign. Doubt appendicitis, colitis, bowel obstruction or any other life-threatening, surgical process.  Labs show potassium of  3.2 which we have replaced. No leukocytosis. Urine shows no sign of infection and no ketones. He does not appear dehydrated on exam. Have recommended increasing fluid intake at home, bland diet. Discussed return precautions. 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 Patient and significant other are comfortable with this plan.   At this time, I do not feel there is any life-threatening condition present. I have reviewed and discussed all results (EKG, imaging, lab, urine as appropriate), exam findings with patient. I have reviewed nursing notes and appropriate previous records.  I feel the patient is safe to be discharged home without further emergent workup. Discussed usual and customary return precautions. Patient and family (if present) verbalize understanding and are comfortable with this plan.  Patient will follow-up with their primary care provider. If they do not have a primary care provider, information for follow-up has been provided to them. All questions have been answered.     I personally performed the services described in this documentation, which was scribed in my presence. The recorded information has been reviewed and is accurate.      Billy Maw Ward, DO 11/02/15 [phone]

## 2015-11-02 NOTE — ED Notes (Signed)
Patient stated he started with fever, chills, diarrhea and nausea about 330 in the afternoon

## 2015-11-02 NOTE — Discharge Instructions (Signed)
Diarrhea Diarrhea is frequent loose and watery bowel movements. It can cause you to feel weak and dehydrated. Dehydration can cause you to become tired and thirsty, have a dry mouth, and have decreased urination that often is dark yellow. Diarrhea is a sign of another problem, most often an infection that will not last long. In most cases, diarrhea typically lasts 2-3 days. However, it can last longer if it is a sign of something more serious. It is important to treat your diarrhea as directed by your caregiver to lessen or prevent future episodes of diarrhea. CAUSES  Some common causes include:  Gastrointestinal infections caused by viruses, bacteria, or parasites.  Food poisoning or food allergies.  Certain medicines, such as antibiotics, chemotherapy, and laxatives.  Artificial sweeteners and fructose.  Digestive disorders. HOME CARE INSTRUCTIONS  Ensure adequate fluid intake (hydration): Have 1 cup (8 oz) of fluid for each diarrhea episode. Avoid fluids that contain simple sugars or sports drinks, fruit juices, whole milk products, and sodas. Your urine should be clear or pale yellow if you are drinking enough fluids. Hydrate with an oral rehydration solution that you can purchase at pharmacies, retail stores, and online. You can prepare an oral rehydration solution at home by mixing the following ingredients together:   - tsp table salt.   tsp baking soda.   tsp salt substitute containing potassium chloride.  1  tablespoons sugar.  1 L (34 oz) of water.  Certain foods and beverages may increase the speed at which food moves through the gastrointestinal (GI) tract. These foods and beverages should be avoided and include:  Caffeinated and alcoholic beverages.  High-fiber foods, such as raw fruits and vegetables, nuts, seeds, and whole grain breads and cereals.  Foods and beverages sweetened with sugar alcohols, such as xylitol, sorbitol, and mannitol.  Some foods may be well  tolerated and may help thicken stool including:  Starchy foods, such as rice, toast, pasta, low-sugar cereal, oatmeal, grits, baked potatoes, crackers, and bagels.  Bananas.  Applesauce.  Add probiotic-rich foods to help increase healthy bacteria in the GI tract, such as yogurt and fermented milk products.  Wash your hands well after each diarrhea episode.  Only take over-the-counter or prescription medicines as directed by your caregiver.  Take a warm bath to relieve any burning or pain from frequent diarrhea episodes. SEEK IMMEDIATE MEDICAL CARE IF:   You are unable to keep fluids down.  You have persistent vomiting.  You have blood in your stool, or your stools are black and tarry.  You do not urinate in 6-8 hours, or there is only a small amount of very dark urine.  You have abdominal pain that increases or localizes.  You have weakness, dizziness, confusion, or light-headedness.  You have a severe headache.  Your diarrhea gets worse or does not get better.  You have a fever or persistent symptoms for more than 2-3 days.  You have a fever and your symptoms suddenly get worse. MAKE SURE YOU:   Understand these instructions.  Will watch your condition.  Will get help right away if you are not doing well or get worse.   This information is not intended to replace advice given to you by your health care provider. Make sure you discuss any questions you have with your health care provider.   Document Released: 05/04/2002 Document Revised: 06/04/2014 Document Reviewed: 01/20/2012 Elsevier Interactive Patient Education 2016 [employer].  Food Choices to Help Relieve Diarrhea, Adult When you have diarrhea,  the foods you eat and your eating habits are very important. Choosing the right foods and drinks can help relieve diarrhea. Also, because diarrhea can last up to 7 days, you need to replace lost fluids and electrolytes (such as sodium, potassium, and chloride) in  order to help prevent dehydration.  WHAT GENERAL GUIDELINES DO I NEED TO FOLLOW?  Slowly drink 1 cup (8 oz) of fluid for each episode of diarrhea. If you are getting enough fluid, your urine will be clear or pale yellow.  Eat starchy foods. Some good choices include white rice, white toast, pasta, low-fiber cereal, baked potatoes (without the skin), saltine crackers, and bagels.  Avoid large servings of any cooked vegetables.  Limit fruit to two servings per day. A serving is  cup or 1 small piece.  Choose foods with less than 2 g of fiber per serving.  Limit fats to less than 8 tsp (38 g) per day.  Avoid fried foods.  Eat foods that have probiotics in them. Probiotics can be found in certain dairy products.  Avoid foods and beverages that may increase the speed at which food moves through the stomach and intestines (gastrointestinal tract). Things to avoid include:  High-fiber foods, such as dried fruit, raw fruits and vegetables, nuts, seeds, and whole grain foods.  Spicy foods and high-fat foods.  Foods and beverages sweetened with high-fructose corn syrup, honey, or sugar alcohols such as xylitol, sorbitol, and mannitol. WHAT FOODS ARE RECOMMENDED? Grains White rice. White, [place], or pita breads (fresh or toasted), including plain rolls, buns, or bagels. White pasta. Saltine, soda, or graham crackers. Pretzels. Low-fiber cereal. Cooked cereals made with water (such as cornmeal, farina, or cream cereals). Plain muffins. Matzo. Melba toast. Zwieback.  Vegetables Potatoes (without the skin). Strained tomato and vegetable juices. Most well-cooked and canned vegetables without seeds. Tender lettuce. Fruits Cooked or canned applesauce, apricots, cherries, fruit cocktail, grapefruit, peaches, pears, or plums. Fresh bananas, apples without skin, cherries, grapes, cantaloupe, grapefruit, peaches, oranges, or plums.  Meat and Other Protein Products Baked or boiled chicken. Eggs. Tofu.  Fish. Seafood. Smooth peanut butter. Ground or well-cooked tender beef, ham, veal, lamb, pork, or poultry.  Dairy Plain yogurt, kefir, and unsweetened liquid yogurt. Lactose-free milk, buttermilk, or soy milk. Plain hard cheese. Beverages Sport drinks. Clear broths. Diluted fruit juices (except prune). Regular, caffeine-free sodas such as ginger ale. Water. Decaffeinated teas. Oral rehydration solutions. Sugar-free beverages not sweetened with sugar alcohols. Other Bouillon, broth, or soups made from recommended foods.  The items listed above may not be a complete list of recommended foods or beverages. Contact your dietitian for more options. WHAT FOODS ARE NOT RECOMMENDED? Grains Whole grain, whole wheat, bran, or rye breads, rolls, pastas, crackers, and cereals. Wild or brown rice. Cereals that contain more than 2 g of fiber per serving. Corn tortillas or taco shells. Cooked or dry oatmeal. Granola. Popcorn. Vegetables Raw vegetables. Cabbage, broccoli, Brussels sprouts, artichokes, baked beans, beet greens, corn, kale, legumes, peas, sweet potatoes, and yams. Potato skins. Cooked spinach and cabbage. Fruits Dried fruit, including raisins and dates. Raw fruits. Stewed or dried prunes. Fresh apples with skin, apricots, mangoes, pears, raspberries, and strawberries.  Meat and Other Protein Products Chunky peanut butter. Nuts and seeds. Beans and lentils. Tomasa BlaseBacon.  Dairy High-fat cheeses. Milk, chocolate milk, and beverages made with milk, such as milk shakes. Cream. Ice cream. Sweets and Desserts Sweet rolls, doughnuts, and sweet breads. Pancakes and waffles. Fats and Oils Butter. Cream sauces. Margarine.  Salad oils. Plain salad dressings. Olives. Avocados.  Beverages Caffeinated beverages (such as coffee, tea, soda, or energy drinks). Alcoholic beverages. Fruit juices with pulp. Prune juice. Soft drinks sweetened with high-fructose corn syrup or sugar alcohols. Other Coconut. Hot sauce.  Chili powder. Mayonnaise. Gravy. Cream-based or milk-based soups.  The items listed above may not be a complete list of foods and beverages to avoid. Contact your dietitian for more information. WHAT SHOULD I DO IF I BECOME DEHYDRATED? Diarrhea can sometimes lead to dehydration. Signs of dehydration include dark urine and dry mouth and skin. If you think you are dehydrated, you should rehydrate with an oral rehydration solution. These solutions can be purchased at pharmacies, retail stores, or online.  Drink -1 cup (120-240 mL) of oral rehydration solution each time you have an episode of diarrhea. If drinking this amount makes your diarrhea worse, try drinking smaller amounts more often. For example, drink 1-3 tsp (5-15 mL) every 5-10 minutes.  A general rule for staying hydrated is to drink 1-2 L of fluid per day. Talk to your health care provider about the specific amount you should be drinking each day. Drink enough fluids to keep your urine clear or pale yellow.   This information is not intended to replace advice given to you by your health care provider. Make sure you discuss any questions you have with your health care provider.   Document Released: 08/04/2003 Document Revised: 06/04/2014 Document Reviewed: 04/06/2013 Elsevier Interactive Patient Education [employer]   To find a primary care or specialty doctor please call [phone] or [phone] to access "[place] Find a Doctor Service."  You may also go on the [facility] website at [email or web address]  There are also multiple [facility] practices throughout the [place] that are frequently accepting new patients. You may find a clinic that is close to your home and contact them.  [facility] -  [address] [place] [address] [phone]  [facility] [place] (also locations in [place] and [place]) -  [address] [place] [address] [phone]  [facility] Department -  [address] [place] [address] [phone]

## 2016-04-30 ENCOUNTER — Emergency Department (HOSPITAL_COMMUNITY)
Admission: EM | Admit: 2016-04-30 | Discharge: 2016-05-01 | Disposition: A | Discharge status: Home / Self Care | Payer: PRIVATE HEALTH INSURANCE | Attending: Emergency Medicine | Admitting: Emergency Medicine

## 2016-04-30 ENCOUNTER — Encounter (HOSPITAL_COMMUNITY): Payer: Self-pay | Admitting: *Deleted

## 2016-04-30 ENCOUNTER — Emergency Department (HOSPITAL_COMMUNITY): Payer: PRIVATE HEALTH INSURANCE

## 2016-04-30 DIAGNOSIS — J40 Bronchitis, not specified as acute or chronic: Secondary | ICD-10-CM | POA: Insufficient documentation

## 2016-04-30 LAB — COMPREHENSIVE METABOLIC PANEL
ALT: 32 U/L (ref 17–63)
AST: 30 U/L (ref 15–41)
Albumin: 3.8 g/dL (ref 3.5–5.0)
Alkaline Phosphatase: 75 U/L (ref 38–126)
Anion gap: 9 (ref 5–15)
BUN: <5 mg/dL — ABNORMAL LOW (ref 6–20)
CO2: 23 mmol/L (ref 22–32)
Calcium: 9.5 mg/dL (ref 8.9–10.3)
Chloride: 105 mmol/L (ref 101–111)
Creatinine, Ser: 1.01 mg/dL (ref 0.61–1.24)
GFR calc Af Amer: >60 mL/min (ref >60)
GFR calc non Af Amer: >60 mL/min (ref >60)
Glucose, Bld: 108 mg/dL — ABNORMAL HIGH (ref 65–99)
Potassium: 3.6 mmol/L (ref 3.5–5.1)
Sodium: 137 mmol/L (ref 135–145)
Total Bilirubin: 0.7 mg/dL (ref 0.3–1.2)
Total Protein: 7.7 g/dL (ref 6.5–8.1)

## 2016-04-30 LAB — CBC WITH DIFFERENTIAL/PLATELET
Basophils Absolute: 0 10*3/uL (ref 0.0–0.1)
Basophils Relative: 0 %
Eosinophils Absolute: 0.3 10*3/uL (ref 0.0–0.7)
Eosinophils Relative: 3 %
HCT: 42.9 % (ref 39.0–52.0)
Hemoglobin: 15.6 g/dL (ref 13.0–17.0)
Lymphocytes Relative: 11 %
Lymphs Abs: 1 10*3/uL (ref 0.7–4.0)
MCH: 29.9 pg (ref 26.0–34.0)
MCHC: 36.4 g/dL — ABNORMAL HIGH (ref 30.0–36.0)
MCV: 82.2 fL (ref 78.0–100.0)
Monocytes Absolute: 1 10*3/uL (ref 0.1–1.0)
Monocytes Relative: 11 %
Neutro Abs: 6.7 10*3/uL (ref 1.7–7.7)
Neutrophils Relative %: 75 %
Platelets: 327 10*3/uL (ref 150–400)
RBC: 5.22 MIL/uL (ref 4.22–5.81)
RDW: 11.9 % (ref 11.5–15.5)
WBC: 9 10*3/uL (ref 4.0–10.5)

## 2016-04-30 MED ORDER — PROMETHAZINE-DM 6.25-15 MG/5ML PO SYRP: 5.0000 mL | ORAL_SOLUTION | Freq: Four times a day (QID) | ORAL | 0 refills | Status: DC | PRN

## 2016-04-30 MED ORDER — IPRATROPIUM-ALBUTEROL 0.5-2.5 (3) MG/3ML IN SOLN
3.0000 mL | Freq: Once | RESPIRATORY_TRACT | Status: AC
  Administered 2016-04-30: 3 mL via RESPIRATORY_TRACT
  Filled 2016-04-30: qty 3

## 2016-04-30 MED ORDER — IBUPROFEN 400 MG PO TABS
400.0000 mg | ORAL_TABLET | Freq: Once | ORAL | Status: AC | PRN
  Administered 2016-04-30: 400 mg via ORAL

## 2016-04-30 MED ORDER — AZITHROMYCIN 250 MG PO TABS: 250.0000 mg | ORAL_TABLET | Freq: Every day | ORAL | 0 refills | Status: DC

## 2016-04-30 MED ORDER — IBUPROFEN 400 MG PO TABS
ORAL_TABLET | ORAL | Status: AC
  Filled 2016-04-30: qty 1

## 2016-04-30 MED ORDER — PREDNISONE 20 MG PO TABS: 40.0000 mg | ORAL_TABLET | Freq: Every day | ORAL | 0 refills | Status: DC

## 2016-04-30 MED ORDER — PREDNISONE 20 MG PO TABS
60.0000 mg | ORAL_TABLET | Freq: Once | ORAL | Status: AC
  Administered 2016-04-30: 60 mg via ORAL
  Filled 2016-04-30: qty 3

## 2016-04-30 NOTE — Discharge Instructions (Signed)
Take your medications as prescribed. Continue drinking fluids at home to remain hydrated. I recommend continuing to use your albuterol inhaler as prescribed at the end for shortness of breath and/or wheezing. He may also take Tylenol and/or ibuprofen as prescribed over-the-counter as needed for body aches. Please follow up with a primary care provider from the Resource Guide provided below in 4-5 days if your symptoms have not improved.  Please return to the Emergency Department if symptoms worsen or new onset of fever, headache, neck stiffness, coughing up blood, chest pain, difficulty breathing, vomiting, and to keep fluids down.

## 2016-04-30 NOTE — ED Triage Notes (Signed)
PT states was tx at an UC for bronchitis and given antibiotics, cough meds and an albuterol inhaler. He states cough is getting worse.  O2 sats 91% in triage.

## 2016-04-30 NOTE — ED Provider Notes (Signed)
[facility]-EMERGENCY DEPT Provider Note   CSN: [id number] Arrival date & time: 04/30/16  1759     History   Chief Complaint Chief Complaint  Patient presents with  . Cough    HPI Billy Perry is a 25 y.o. male.  HPI   Patient is a 25 year old male with no pertinent past medical history who presents the ED with complaint of cough, onset 1.5 weeks. Patient reports he has had a worsening productive cough for the past week and a half with associated shortness of breath and wheezing. He notes he initially had a fever when his symptoms started but states he has not had a fever over the past few days. He notes he was evaluated at an urgent care 2 days ago, was diagnosed with bronchitis and discharged home with Tessalon, albuterol inhaler and Bactrim. Patient states he has been taking his medications without relief. He notes his symptoms have worsened over the past 2 days. Endorses associated pain to the sides of his ribs that only occurs when coughing. Denies headache, sinus pressure, nasal congestion, rhinorrhea, ear pain, sore throat, hemoptysis, abdominal pain, vomiting. Patient reports both his significant other and son have also been sick with similar symptoms over the past week.  Past Medical History:  Diagnosis Date  . Asthma     There are no active problems to display for this patient.   History reviewed. No pertinent surgical history.     Home Medications    Prior to Admission medications   Medication Sig Start Date End Date Taking? Authorizing Provider  albuterol (PROVENTIL HFA;VENTOLIN HFA) 108 (90 Base) MCG/ACT inhaler Inhale 1-2 puffs into the lungs every 6 (six) hours as needed for wheezing or shortness of breath.   Yes Historical Provider, MD  benzonatate (TESSALON) 100 MG capsule Take 100 mg by mouth 3 (three) times daily as needed for cough.   Yes Historical Provider, MD  sulfamethoxazole-trimethoprim (BACTRIM DS,SEPTRA DS) 800-160 MG tablet Take 1 tablet by mouth  2 (two) times daily.   Yes Historical Provider, MD  azithromycin (ZITHROMAX) 250 MG tablet Take 1 tablet (250 mg total) by mouth daily. Take first 2 tablets together, then 1 every day until finished. 04/30/16   Barrett HenleNicole Elizabeth Daltin Crist, PA-C  loperamide (IMODIUM) 2 MG capsule Take 1 capsule (2 mg total) by mouth 4 (four) times daily as needed for diarrhea or loose stools. Patient not taking: Reported on 04/30/2016 11/02/15   Kristen N Ward, DO  ondansetron (ZOFRAN ODT) 4 MG disintegrating tablet Take 1 tablet (4 mg total) by mouth every 8 (eight) hours as needed for nausea or vomiting. Patient not taking: Reported on 04/30/2016 11/02/15   Layla MawKristen N Ward, DO  predniSONE (DELTASONE) 20 MG tablet Take 2 tablets (40 mg total) by mouth daily. 04/30/16   Barrett HenleNicole Elizabeth Kaiyon Hynes, PA-C  promethazine-dextromethorphan (PROMETHAZINE-DM) 6.25-15 MG/5ML syrup Take 5 mLs by mouth 4 (four) times daily as needed for cough. 04/30/16   Barrett HenleNicole Elizabeth Farhaan Mabee, PA-C    Family History No family history on file.  Social History Social History  Substance Use Topics  . Smoking status: Never Smoker  . Smokeless tobacco: Never Used  . Alcohol use Yes     Comment: occ     Allergies   Amoxicillin   Review of Systems Review of Systems  Constitutional: Positive for fever.  Respiratory: Positive for cough, shortness of breath and wheezing.   All other systems reviewed and are negative.    Physical Exam Updated Vital Signs BP  132/75   Pulse 84   Temp 98.3 F (36.8 C)   Resp 16   Ht 5\' 11"  (1.803 m)   Wt 106.6 kg   SpO2 93%   BMI 32.78 kg/m   Physical Exam  Constitutional: He is oriented to person, place, and time. He appears well-developed and well-nourished. No distress.  HENT:  Head: Normocephalic and atraumatic.  Right Ear: Tympanic membrane normal.  Left Ear: Tympanic membrane normal.  Nose: Nose normal. Right sinus exhibits no maxillary sinus tenderness and no frontal sinus tenderness. Left sinus  exhibits no maxillary sinus tenderness and no frontal sinus tenderness.  Mouth/Throat: Uvula is midline, oropharynx is clear and moist and mucous membranes are normal. No oropharyngeal exudate, posterior oropharyngeal edema, posterior oropharyngeal erythema or tonsillar abscesses. No tonsillar exudate.  Eyes: Conjunctivae and EOM are normal. Right eye exhibits no discharge. Left eye exhibits no discharge. No scleral icterus.  Neck: Normal range of motion. Neck supple.  Cardiovascular: Normal rate, regular rhythm, normal heart sounds and intact distal pulses.   Pulmonary/Chest: Effort normal. No respiratory distress. He has wheezes (mild wheezing noted in LLL). He has no rales. He exhibits no tenderness.  Abdominal: Soft. Bowel sounds are normal. He exhibits no distension. There is no tenderness.  Musculoskeletal: Normal range of motion. He exhibits no edema.  Lymphadenopathy:    He has no cervical adenopathy.  Neurological: He is alert and oriented to person, place, and time.  Skin: Skin is warm and dry. He is not diaphoretic.  Nursing note and vitals reviewed.    ED Treatments / Results  Labs (all labs ordered are listed, but only abnormal results are displayed) Labs Reviewed  CBC WITH DIFFERENTIAL/PLATELET - Abnormal; Notable for the following:       Result Value   MCHC 36.4 (*)    All other components within normal limits  COMPREHENSIVE METABOLIC PANEL - Abnormal; Notable for the following:    Glucose, Bld 108 (*)    BUN <5 (*)    All other components within normal limits    EKG  EKG Interpretation None       Radiology Dg Chest 2 View  Result Date: 04/30/2016 CLINICAL DATA:  Cough, shortness of breath and chest pain for a few days. EXAM: CHEST  2 VIEW COMPARISON:  Single-view of the chest 10/11/1,008. FINDINGS: There is some peribronchial thickening. No consolidative process, pneumothorax or effusion. Heart size is normal. No focal bony abnormality. IMPRESSION: Findings  suggestive of bronchitis.  No focal abnormality. Electronically Signed   By: Drusilla Kannerhomas  Dalessio M.D.   On: 04/30/2016 19:29    Procedures Procedures (including critical care time)  Medications Ordered in ED Medications  ibuprofen (ADVIL,MOTRIN) tablet 400 mg (400 mg Oral Given 04/30/16 1820)  predniSONE (DELTASONE) tablet 60 mg (60 mg Oral Given 04/30/16 2235)  ipratropium-albuterol (DUONEB) 0.5-2.5 (3) MG/3ML nebulizer solution 3 mL (3 mLs Nebulization Given 04/30/16 2235)     Initial Impression / Assessment and Plan / ED Course  I have reviewed the triage vital signs and the nursing notes.  Pertinent labs & imaging results that were available during my care of the patient were reviewed by me and considered in my medical decision making (see chart for details).  Clinical Course     Patient presents with worsening cough, shortness of breath and wheezing over the past week and a half. He notes he was evaluated at an urgent care 3 days ago, diagnosed with bronchitis. Patient reports he has been taking his  prescriptions as prescribed including Bactrim but notes he has continued to have worsening symptoms. Reports his significant other and son have also had similar symptoms at home. VSS. Exam revealed expiratory wheezes and left lower lobe, no signs of respiratory distress or increased work of breathing. O2 saturation 96% on room air. Patient given DuoNeb treatment and prednisone. Labs unremarkable. Chest x-ray showed findings suggestive of bronchitis, no focal abnormality. On reevaluation patient reports improvement of his symptoms. Reexamination revealed mild improvement of wheezing. Patient's O2 saturation remained >95%. Discussed results and plan for discharge patient. Plan to discharge patient home with prescription of azithromycin, antitussive and steroids. Advised patient to continue using his albuterol inhaler as prescribed and to discontinue taking his prescription of Bactrim. Advised patient  to follow up with PCP as needed. Discussed return precautions.  Final Clinical Impressions(s) / ED Diagnoses   Final diagnoses:  Bronchitis    New Prescriptions New Prescriptions   AZITHROMYCIN (ZITHROMAX) 250 MG TABLET    Take 1 tablet (250 mg total) by mouth daily. Take first 2 tablets together, then 1 every day until finished.   PREDNISONE (DELTASONE) 20 MG TABLET    Take 2 tablets (40 mg total) by mouth daily.   PROMETHAZINE-DEXTROMETHORPHAN (PROMETHAZINE-DM) 6.25-15 MG/5ML SYRUP    Take 5 mLs by mouth 4 (four) times daily as needed for cough.     Satira Sark Timpson, [place] 04/30/16 2322    Linwood Dibbles, MD 05/01/16 [phone]

## 2016-05-03 ENCOUNTER — Encounter (HOSPITAL_COMMUNITY): Payer: Self-pay | Admitting: Emergency Medicine

## 2016-05-03 ENCOUNTER — Emergency Department (HOSPITAL_COMMUNITY)
Admission: EM | Admit: 2016-05-03 | Discharge: 2016-05-03 | Disposition: A | Discharge status: Home / Self Care | Payer: Self-pay | Attending: Emergency Medicine | Admitting: Emergency Medicine

## 2016-05-03 ENCOUNTER — Emergency Department (HOSPITAL_COMMUNITY): Payer: Self-pay

## 2016-05-03 DIAGNOSIS — Z79899 Other long term (current) drug therapy: Secondary | ICD-10-CM | POA: Insufficient documentation

## 2016-05-03 DIAGNOSIS — J45909 Unspecified asthma, uncomplicated: Secondary | ICD-10-CM | POA: Insufficient documentation

## 2016-05-03 DIAGNOSIS — J181 Lobar pneumonia, unspecified organism: Secondary | ICD-10-CM | POA: Insufficient documentation

## 2016-05-03 DIAGNOSIS — J189 Pneumonia, unspecified organism: Secondary | ICD-10-CM

## 2016-05-03 MED ORDER — LEVOFLOXACIN 750 MG PO TABS: 750.0000 mg | ORAL_TABLET | Freq: Every day | ORAL | 0 refills | Status: AC

## 2016-05-03 MED ORDER — IPRATROPIUM-ALBUTEROL 0.5-2.5 (3) MG/3ML IN SOLN
3.0000 mL | Freq: Once | RESPIRATORY_TRACT | Status: AC
  Administered 2016-05-03: 3 mL via RESPIRATORY_TRACT
  Filled 2016-05-03: qty 3

## 2016-05-03 MED ORDER — ALBUTEROL SULFATE (2.5 MG/3ML) 0.083% IN NEBU: 5.0000 mg | INHALATION_SOLUTION | Freq: Four times a day (QID) | RESPIRATORY_TRACT | 1 refills | Status: DC | PRN

## 2016-05-03 MED ORDER — BENZONATATE 100 MG PO CAPS: 100.0000 mg | ORAL_CAPSULE | Freq: Three times a day (TID) | ORAL | 0 refills | Status: DC

## 2016-05-03 NOTE — ED Provider Notes (Signed)
[facility] Provider Note   CSN: [id number] Arrival date & time: 05/03/16  1020  By signing my name below, I, Sonum Patel, attest that this documentation has been prepared under the direction and in the presence of Audry Piliyler Samaa Ueda, PA-C. Electronically Signed: Sonum Patel, Neurosurgeoncribe. 05/03/16. 11:24 AM.  History   Chief Complaint Chief Complaint  Patient presents with  . Cough  . chest congestion    The history is provided by the patient. No language interpreter was used.     HPI Comments: Billy Perry is a 25 y.o. male with past medical history of asthma who presents to the Emergency Department complaining of 10 days of a persistent cough that has gradually worsened over the last few days. [facility] and was prescribed Bactrim without relief. After that he was seen in the ED on 04/30/16 and was discharged home with a Z-pac, prednisone, and a cough syrup which he has taken with some relief. He reports a history of asthma and typically uses an albuterol inhaler which he is currently out of. He denies fever.   Past Medical History:  Diagnosis Date  . Asthma     There are no active problems to display for this patient.   History reviewed. No pertinent surgical history.     Home Medications    Prior to Admission medications   Medication Sig Start Date End Date Taking? Authorizing Provider  albuterol (PROVENTIL HFA;VENTOLIN HFA) 108 (90 Base) MCG/ACT inhaler Inhale 1-2 puffs into the lungs every 6 (six) hours as needed for wheezing or shortness of breath.    Historical Provider, MD  azithromycin (ZITHROMAX) 250 MG tablet Take 1 tablet (250 mg total) by mouth daily. Take first 2 tablets together, then 1 every day until finished. 04/30/16   Barrett HenleNicole Elizabeth Nadeau, PA-C  benzonatate (TESSALON) 100 MG capsule Take 100 mg by mouth 3 (three) times daily as needed for cough.    Historical Provider, MD  loperamide (IMODIUM) 2 MG capsule Take 1 capsule (2 mg total)  by mouth 4 (four) times daily as needed for diarrhea or loose stools. Patient not taking: Reported on 04/30/2016 11/02/15   Kristen N Ward, DO  ondansetron (ZOFRAN ODT) 4 MG disintegrating tablet Take 1 tablet (4 mg total) by mouth every 8 (eight) hours as needed for nausea or vomiting. Patient not taking: Reported on 04/30/2016 11/02/15   Layla MawKristen N Ward, DO  predniSONE (DELTASONE) 20 MG tablet Take 2 tablets (40 mg total) by mouth daily. 04/30/16   Barrett HenleNicole Elizabeth Nadeau, PA-C  promethazine-dextromethorphan (PROMETHAZINE-DM) 6.25-15 MG/5ML syrup Take 5 mLs by mouth 4 (four) times daily as needed for cough. 04/30/16   Barrett HenleNicole Elizabeth Nadeau, PA-C  sulfamethoxazole-trimethoprim (BACTRIM DS,SEPTRA DS) 800-160 MG tablet Take 1 tablet by mouth 2 (two) times daily.    Historical Provider, MD    Family History No family history on file.  Social History Social History  Substance Use Topics  . Smoking status: Never Smoker  . Smokeless tobacco: Never Used  . Alcohol use Yes     Comment: socially     Allergies   Amoxicillin   Review of Systems Review of Systems  Constitutional: Negative for fever.  Respiratory: Positive for cough.    Physical Exam Updated Vital Signs BP 126/85 (BP Location: Left Arm)   Pulse 78   Temp 98.2 F (36.8 C) (Oral)   Resp 17   Ht 5\' 11"  (1.803 m)   Wt 235 lb (106.6 kg)  SpO2 94%   BMI 32.78 kg/m   Physical Exam  Constitutional: He is oriented to person, place, and time. He appears well-developed and well-nourished. No distress.  HENT:  Head: Normocephalic and atraumatic.  Right Ear: Tympanic membrane, external ear and ear canal normal.  Left Ear: Tympanic membrane, external ear and ear canal normal.  Nose: Nose normal.  Mouth/Throat: Uvula is midline, oropharynx is clear and moist and mucous membranes are normal. No trismus in the jaw. No oropharyngeal exudate, posterior oropharyngeal erythema or tonsillar abscesses.  Eyes: EOM are normal. Pupils are  equal, round, and reactive to light.  Neck: Normal range of motion. Neck supple. No tracheal deviation present.  Cardiovascular: Normal rate, regular rhythm, S1 normal, S2 normal, normal heart sounds, intact distal pulses and normal pulses.   Pulmonary/Chest: Effort normal. No respiratory distress. He has no decreased breath sounds. He has wheezes in the right upper field, the right middle field, the right lower field, the left upper field, the left middle field and the left lower field. He has rhonchi in the right upper field and the right lower field. He has no rales.  Abdominal: Normal appearance and bowel sounds are normal. There is no tenderness.  Musculoskeletal: Normal range of motion.  Neurological: He is alert and oriented to person, place, and time.  Skin: Skin is warm and dry.  Psychiatric: He has a normal mood and affect. His speech is normal and behavior is normal. Thought content normal.  Nursing note and vitals reviewed.  ED Treatments / Results  DIAGNOSTIC STUDIES: Oxygen Saturation is 94% on RA, adequate by my interpretation.    COORDINATION OF CARE: 11:29 AM Discussed treatment plan with pt at bedside and pt agreed to plan.   Labs (all labs ordered are listed, but only abnormal results are displayed) Labs Reviewed - No data to display  EKG  EKG Interpretation None      Radiology Dg Chest 2 View  Result Date: 05/03/2016 CLINICAL DATA:  Cough, fever. EXAM: CHEST  2 VIEW COMPARISON:  Radiographs of April 30, 2016. FINDINGS: The heart size and mediastinal contours are within normal limits. No pneumothorax or pleural effusion is noted. Right lung is clear. Mild left basilar opacity is noted concerning for subsegmental atelectasis or infiltrate. The visualized skeletal structures are unremarkable. IMPRESSION: Mild left basilar subsegmental atelectasis or pneumonia. Electronically Signed   By: Lupita RaiderJames  Green Jr, M.D.   On: 05/03/2016 11:58    Procedures Procedures  (including critical care time)  Medications Ordered in ED Medications  ipratropium-albuterol (DUONEB) 0.5-2.5 (3) MG/3ML nebulizer solution 3 mL (3 mLs Nebulization Given 05/03/16 1154)     Initial Impression / Assessment and Plan / ED Course  I have reviewed the triage vital signs and the nursing notes.  Pertinent labs & imaging results that were available during my care of the patient were reviewed by me and considered in my medical decision making (see chart for details).  Clinical Course     Final Clinical Impressions(s) / ED Diagnoses     I have reviewed the relevant previous healthcare records.  I obtained HPI from historian.  ED Course:  Assessment: Pt is a 25yM with hx Asthma who presents with URI symptoms x 1.5-2 weeks. Seen by UC and ED for same. Given Rx bactrim the first time. Return to ED and recevied prednisone, Z-Pack, and anti-tussives. Notes minimal relief at home. No improvement. No fevers. Ran out of albuterol inhaler.  On exam, pt in NAD. Nontoxic/nonseptic appearing.  VSS. Afebrile. Lungs with bilateral wheeze and rhonchi on right. Heart RRR. O2 Saturations >95% RA. CXR shows possible left basilar pneumonia vs atelectasis. Given neb treatment in ED with improvement. Discussed with supervising physician. Plan is to DC home with Levaquin and scheduled albuterol treatments. Follow up with PCP. At time of discharge, Patient is in no acute distress. Vital Signs are stable. Patient is able to ambulate. Patient able to tolerate PO.   Disposition/Plan:  DC Home Additional Verbal discharge instructions given and discussed with patient.  Pt Instructed to f/u with PCP in the next week for evaluation and treatment of symptoms. Return precautions given Pt acknowledges and agrees with plan  Supervising Physician Derwood Kaplan, MD  Final diagnoses:  Community acquired pneumonia of left lower lobe of lung (HCC)    New Prescriptions New Prescriptions   ALBUTEROL (PROVENTIL)  (2.5 MG/3ML) 0.083% NEBULIZER SOLUTION    Take 6 mLs (5 mg total) by nebulization every 6 (six) hours as needed for wheezing or shortness of breath.   BENZONATATE (TESSALON) 100 MG CAPSULE    Take 1 capsule (100 mg total) by mouth every 8 (eight) hours.   LEVOFLOXACIN (LEVAQUIN) 750 MG TABLET    Take 1 tablet (750 mg total) by mouth daily.    I personally performed the services described in this documentation, which was scribed in my presence. The recorded information has been reviewed and is accurate.    Audry Pili, PA-C 05/03/16 1227    Gerhard Munch, MD 05/03/16 [phone]

## 2016-05-03 NOTE — ED Triage Notes (Signed)
Patient states has bronchitis for over a week.  Patient states that he still has bronchitis and still coughing up green.  Patient states rib pain when he coughs only.

## 2016-05-03 NOTE — Discharge Instructions (Signed)
Please read and follow all provided instructions.  Your diagnoses today include:  1. Community acquired pneumonia of left lower lobe of lung (HCC)    Tests performed today include: Chest x-ray -- shows pneumonia Vital signs. See below for your results today.   Medications prescribed:   Take any prescribed medications only as directed.  DO BREATHING TREATMENTS EVERY 6 HOURS  Home care instructions:  Follow any educational materials contained in this packet.  Take the complete course of antibiotics that you were prescribed.   BE VERY CAREFUL not to take multiple medicines containing Tylenol (also called acetaminophen). Doing so can lead to an overdose which can damage your liver and cause liver failure and possibly death.   Follow-up instructions: Please follow-up with your primary care provider in the next 3 days for further evaluation of your symptoms and to ensure resolution of your infection.   Return instructions:  Please return to the Emergency Department if you experience worsening symptoms.  Return immediately with worsening breathing, worsening shortness of breath, or if you feel it is taking you more effort to breathe.  Please return if you have any other emergent concerns.  Additional Information:  Your vital signs today were: BP 126/85 (BP Location: Left Arm)    Pulse 78    Temp 98.2 F (36.8 C) (Oral)    Resp 17    Ht 5\' 11"  (1.803 m)    Wt 106.6 kg    SpO2 94%    BMI 32.78 kg/m  If your blood pressure (BP) was elevated above 135/85 this visit, please have this repeated by your doctor within one month. --------------

## 2017-07-10 ENCOUNTER — Ambulatory Visit (HOSPITAL_COMMUNITY)
Admission: EM | Admit: 2017-07-10 | Discharge: 2017-07-10 | Disposition: A | Discharge status: Home / Self Care | Payer: PRIVATE HEALTH INSURANCE | Attending: Family Medicine | Admitting: Family Medicine

## 2017-07-10 ENCOUNTER — Other Ambulatory Visit: Payer: Self-pay

## 2017-07-10 ENCOUNTER — Encounter (HOSPITAL_COMMUNITY): Payer: Self-pay | Admitting: Emergency Medicine

## 2017-07-10 DIAGNOSIS — H66001 Acute suppurative otitis media without spontaneous rupture of ear drum, right ear: Secondary | ICD-10-CM | POA: Diagnosis not present

## 2017-07-10 MED ORDER — AZITHROMYCIN 250 MG PO TABS: 250.0000 mg | ORAL_TABLET | Freq: Every day | ORAL | 0 refills | Status: DC

## 2017-07-10 NOTE — ED Triage Notes (Signed)
Pt sates for the last few days hes not been able to hear out of his R ear.

## 2017-07-17 NOTE — ED Provider Notes (Signed)
  [facility]   [id number] 07/10/17 Arrival Time: 1804  ASSESSMENT & PLAN:  1. Acute suppurative otitis media of right ear without spontaneous rupture of tympanic membrane, recurrence not specified    Meds ordered this encounter  Medications  . azithromycin (ZITHROMAX) 250 MG tablet    Sig: Take 1 tablet (250 mg total) by mouth daily. Take first 2 tablets together, then 1 every day until finished.    Dispense:  6 tablet    Refill:  0   Discussed typical duration of symptoms. OTC symptom care as needed. Ensure adequate fluid intake and rest. May f/u with PCP or here as needed.  Reviewed expectations re: course of current medical issues. Questions answered. Outlined signs and symptoms indicating need for more acute intervention. Patient verbalized understanding. After Visit Summary given.   SUBJECTIVE: History from: patient.  Jerrye NobleChristopher C Walder is a 27 y.o. male who presents with complaint of right otalgia without drainage. Onset gradual, approximately a few days ago. Recent cold symptoms: none. Fever: no. Overall normal PO intake without n/v. Sick contacts: no. OTC treatment: None. No specific aggravating or alleviating factors reported. Some decreased hearing reported on L.  Social History   Tobacco Use  Smoking Status Never Smoker  Smokeless Tobacco Never Used    ROS: As per HPI.   OBJECTIVE:  Vitals:   07/10/17 1858  BP: 123/75  Pulse: 75  Resp: 18  Temp: 98.4 F (36.9 C)  SpO2: 100%     General appearance: alert; appears fatigued Ear Canal: normal TM: right: erythematous, bulging Neck: supple without LAD Lungs: unlabored respirations, symmetrical air entry; cough: absent; no respiratory distress Skin: warm and dry Psychological: alert and cooperative; normal mood and affect  Allergies  Allergen Reactions  . Amoxicillin Hives    Childhood     Past Medical History:  Diagnosis Date  . Asthma    No family history on file. Social  History   Socioeconomic History  . Marital status: Single    Spouse name: Not on file  . Number of children: Not on file  . Years of education: Not on file  . Highest education level: Not on file  Social Needs  . Financial resource strain: Not on file  . Food insecurity - worry: Not on file  . Food insecurity - inability: Not on file  . Transportation needs - medical: Not on file  . Transportation needs - non-medical: Not on file  Occupational History  . Not on file  Tobacco Use  . Smoking status: Never Smoker  . Smokeless tobacco: Never Used  Substance and Sexual Activity  . Alcohol use: Yes    Comment: socially  . Drug use: No  . Sexual activity: Not on file  Other Topics Concern  . Not on file  Social History Narrative  . Not on file            Mardella LaymanHagler, Jensyn Cambria, MD 07/17/17 [phone]

## 2017-12-04 ENCOUNTER — Encounter (HOSPITAL_COMMUNITY): Payer: Self-pay

## 2017-12-04 ENCOUNTER — Telehealth (HOSPITAL_COMMUNITY): Payer: Self-pay

## 2017-12-04 ENCOUNTER — Ambulatory Visit (HOSPITAL_COMMUNITY)
Admission: EM | Admit: 2017-12-04 | Discharge: 2017-12-04 | Disposition: A | Discharge status: Home / Self Care | Payer: PRIVATE HEALTH INSURANCE | Attending: Family Medicine | Admitting: Family Medicine

## 2017-12-04 DIAGNOSIS — R05 Cough: Secondary | ICD-10-CM

## 2017-12-04 DIAGNOSIS — J029 Acute pharyngitis, unspecified: Secondary | ICD-10-CM | POA: Diagnosis not present

## 2017-12-04 DIAGNOSIS — K121 Other forms of stomatitis: Secondary | ICD-10-CM | POA: Diagnosis not present

## 2017-12-04 DIAGNOSIS — J45909 Unspecified asthma, uncomplicated: Secondary | ICD-10-CM | POA: Insufficient documentation

## 2017-12-04 DIAGNOSIS — Z880 Allergy status to penicillin: Secondary | ICD-10-CM | POA: Insufficient documentation

## 2017-12-04 DIAGNOSIS — M791 Myalgia, unspecified site: Secondary | ICD-10-CM | POA: Diagnosis not present

## 2017-12-04 LAB — POCT RAPID STREP A: Streptococcus, Group A Screen (Direct): NEGATIVE

## 2017-12-04 MED ORDER — MAGIC MOUTHWASH: 5.0000 mL | Freq: Four times a day (QID) | ORAL | 0 refills | Status: DC | PRN

## 2017-12-04 MED ORDER — CEFDINIR 300 MG PO CAPS: 300.0000 mg | ORAL_CAPSULE | Freq: Two times a day (BID) | ORAL | 0 refills | Status: AC

## 2017-12-04 NOTE — Discharge Instructions (Addendum)
It was nice meeting you!!  Your strep was negative but I will go ahead and treat you based on the symptoms and how bad your throat looked. We will send for culture.  I will also give you some mouthwash for relief of symptoms.  You can also try warm salt water gargles for relief.  Tylenol and Motrin for pain and fever.

## 2017-12-04 NOTE — ED Triage Notes (Signed)
Pt presents with lesions on inside of mouth and throat area.

## 2017-12-04 NOTE — ED Provider Notes (Addendum)
[facility]    CSN: [id number] Arrival date & time: 12/04/17  0946     History   Chief Complaint Chief Complaint  Patient presents with  . Mouth Lesions    inside of mouth    HPI Billy Perry is a 27 y.o. male.   Patient is a 27 year old male with history of asthma complaining of  2 days of sore throat, and blisters in mouth.  He has pain with swallowing.  This is been a constant problem and worsening.  He has also been coughing and had some body aches with low-grade fever.  He has been using Tylenol and NyQuil for relief of symptoms.  He denies any ear pain, sinus congestion, runny nose, sneezing, eye pain or discharge.  He does not smoke.  ROS per HPI      Past Medical History:  Diagnosis Date  . Asthma     There are no active problems to display for this patient.   History reviewed. No pertinent surgical history.     Home Medications    Prior to Admission medications   Medication Sig Start Date End Date Taking? Authorizing Provider  albuterol (PROVENTIL) (2.5 MG/3ML) 0.083% nebulizer solution Take 6 mLs (5 mg total) by nebulization every 6 (six) hours as needed for wheezing or shortness of breath. 05/03/16   Audry PiliMohr, Tyler, PA-C  azithromycin (ZITHROMAX) 250 MG tablet Take 1 tablet (250 mg total) by mouth daily. Take first 2 tablets together, then 1 every day until finished. 07/10/17   Mardella LaymanHagler, Brian, MD  benzonatate (TESSALON) 100 MG capsule Take 1 capsule (100 mg total) by mouth every 8 (eight) hours. Patient not taking: Reported on 07/10/2017 05/03/16   Audry PiliMohr, Tyler, PA-C  cefdinir (OMNICEF) 300 MG capsule Take 1 capsule (300 mg total) by mouth 2 (two) times daily for 10 days. 12/04/17 12/14/17  Dahlia ByesBast, Allyanna Appleman A, NP  loperamide (IMODIUM) 2 MG capsule Take 1 capsule (2 mg total) by mouth 4 (four) times daily as needed for diarrhea or loose stools. Patient not taking: Reported on 04/30/2016 11/02/15   Ward, Layla MawKristen N, DO  magic mouthwash SOLN Take 5 mLs by  mouth 4 (four) times daily as needed for mouth pain. 12/04/17   Kristoff Coonradt, Gloris Manchesterraci A, NP  ondansetron (ZOFRAN ODT) 4 MG disintegrating tablet Take 1 tablet (4 mg total) by mouth every 8 (eight) hours as needed for nausea or vomiting. Patient not taking: Reported on 04/30/2016 11/02/15   Ward, Layla MawKristen N, DO  predniSONE (DELTASONE) 20 MG tablet Take 2 tablets (40 mg total) by mouth daily. Patient not taking: Reported on 07/10/2017 04/30/16   Barrett HenleNadeau, Nicole Elizabeth, PA-C  promethazine-dextromethorphan (PROMETHAZINE-DM) 6.25-15 MG/5ML syrup Take 5 mLs by mouth 4 (four) times daily as needed for cough. Patient not taking: Reported on 07/10/2017 04/30/16   Barrett HenleNadeau, Nicole Elizabeth, PA-C  sulfamethoxazole-trimethoprim (BACTRIM DS,SEPTRA DS) 800-160 MG tablet Take 1 tablet by mouth 2 (two) times daily.    [provider]    Family History History reviewed. No pertinent family history.  Social History Social History   Tobacco Use  . Smoking status: Never Smoker  . Smokeless tobacco: Never Used  Substance Use Topics  . Alcohol use: Yes    Comment: socially  . Drug use: No     Allergies   Amoxicillin   Review of Systems Review of Systems  Constitutional: Positive for chills and fever.  HENT: Positive for sore throat, trouble swallowing and voice change.   Musculoskeletal: Positive for  myalgias.  Hematological: Positive for adenopathy.     Physical Exam Triage Vital Signs ED Triage Vitals  Enc Vitals Group     BP 12/04/17 1004 129/76     Pulse Rate 12/04/17 1004 75     Resp 12/04/17 1004 20     Temp 12/04/17 1004 98.8 F (37.1 C)     Temp Source 12/04/17 1004 Oral     SpO2 12/04/17 1004 96 %     Weight --      Height --      Head Circumference --      Peak Flow --      Pain Score 12/04/17 1002 9     Pain Loc --      Pain Edu? --      Excl. in GC? --    No data found.  Updated Vital Signs BP 129/76 (BP Location: Right Arm)   Pulse 75   Temp 98.8 F (37.1 C) (Oral)    Resp 20   SpO2 96%   Visual Acuity Right Eye Distance:   Left Eye Distance:   Bilateral Distance:    Right Eye Near:   Left Eye Near:    Bilateral Near:     Physical Exam  Constitutional: He is oriented to person, place, and time. He appears well-developed and well-nourished.  HENT:  Head: Normocephalic and atraumatic.  Right Ear: External ear normal.  Left Ear: External ear normal.  Mouth/Throat: Oropharyngeal exudate present.  Tonsils 2+ with exudate and erythematous.  Multiple ulcers to buccal mucosa.  Cerumen in both ears  Eyes: Pupils are equal, round, and reactive to light.  Neck: Normal range of motion.  Cardiovascular: Normal rate and regular rhythm.  Pulmonary/Chest: Effort normal and breath sounds normal.  Lymphadenopathy:    He has cervical adenopathy.  Neurological: He is alert and oriented to person, place, and time.  Skin: Skin is warm and dry.  Psychiatric: He has a normal mood and affect.  Nursing note and vitals reviewed.    UC Treatments / Results  Labs (all labs ordered are listed, but only abnormal results are displayed) Labs Reviewed  CULTURE, GROUP A STREP [facility])  POCT RAPID STREP A    EKG None  Radiology No results found.  Procedures Procedures (including critical care time)  Medications Ordered in UC Medications - No data to display  Initial Impression / Assessment and Plan / UC Course  I have reviewed the triage vital signs and the nursing notes.  Pertinent labs & imaging results that were available during my care of the patient were reviewed by me and considered in my medical decision making (see chart for details).     Based on symptoms and patient presentation I Will treat for strep pharyngitis. .  Will send strep for culture.  Magic mouthwash for stomatitis. Return precautions given.  Final Clinical Impressions(s) / UC Diagnoses   Final diagnoses:  Acute pharyngitis, unspecified etiology  Stomatitis     Discharge  Instructions     It was nice meeting you!!  Your strep was negative but I will go ahead and treat you based on the symptoms and how bad your throat looked. We will send for culture.  I will also give you some mouthwash for relief of symptoms.  You can also try warm salt water gargles for relief.  Tylenol and Motrin for pain and fever.     ED Prescriptions    Medication Sig Dispense Auth. Provider   cefdinir (OMNICEF)  300 MG capsule Take 1 capsule (300 mg total) by mouth 2 (two) times daily for 10 days. 20 capsule Terrel Nesheiwat A, NP   magic mouthwash SOLN Take 5 mLs by mouth 4 (four) times daily as needed for mouth pain. 100 mL Dahlia Byes A, NP     Controlled Substance Prescriptions [place] Controlled Substance Registry consulted? Not Applicable   Janace Aris, NP 12/04/17 1129    Dahlia Byes A, NP 12/04/17 1531

## 2017-12-05 ENCOUNTER — Encounter (HOSPITAL_COMMUNITY): Payer: Self-pay | Admitting: Emergency Medicine

## 2017-12-05 ENCOUNTER — Emergency Department (HOSPITAL_COMMUNITY)
Admission: EM | Admit: 2017-12-05 | Discharge: 2017-12-05 | Disposition: A | Discharge status: Home / Self Care | Payer: PRIVATE HEALTH INSURANCE | Attending: Emergency Medicine | Admitting: Emergency Medicine

## 2017-12-05 DIAGNOSIS — B084 Enteroviral vesicular stomatitis with exanthem: Secondary | ICD-10-CM

## 2017-12-05 DIAGNOSIS — Z79899 Other long term (current) drug therapy: Secondary | ICD-10-CM | POA: Insufficient documentation

## 2017-12-05 DIAGNOSIS — J45909 Unspecified asthma, uncomplicated: Secondary | ICD-10-CM | POA: Insufficient documentation

## 2017-12-05 MED ORDER — ACETAMINOPHEN 325 MG PO TABS
650.0000 mg | ORAL_TABLET | Freq: Once | ORAL | Status: AC | PRN
  Administered 2017-12-05: 650 mg via ORAL
  Filled 2017-12-05: qty 2

## 2017-12-05 NOTE — Discharge Instructions (Addendum)
Please read attached information. If you experience any new or worsening signs or symptoms please return to the emergency room for evaluation. Please follow-up with your primary care provider or specialist as discussed. Please use previously prescribed medications as directed.

## 2017-12-05 NOTE — ED Provider Notes (Signed)
[facility] EMERGENCY DEPARTMENT Provider Note   CSN: [id number] Arrival date & time: 12/05/17  1945     History   Chief Complaint Chief Complaint  Patient presents with  . mouth "sores"  . Fever    HPI Billy Perry is a 27 y.o. male.  HPI   27 year old male presents today with complaints of sore throat. Patient notes a four-day history of soreness to his oropharynx. He notes 2 days ago he developed a fever, and yesterday was seen by urgent care. Patient notes a dry nonproductive cough, body aches, fever at home. Patient notes using Tylenol and NyQuil for relief of symptoms. He was prescribed Magic mouthwash which also improves his symptoms. Patient was also prescribed Ceftin year. Patient denies any significant upper respiratory complaints including runny nose nasal congestion. Patient notes that he has a young child at home that is sick with hand-foot-and-mouth disease.    Past Medical History:  Diagnosis Date  . Asthma     There are no active problems to display for this patient.   History reviewed. No pertinent surgical history.      Home Medications    Prior to Admission medications   Medication Sig Start Date End Date Taking? Authorizing Provider  albuterol (PROVENTIL) (2.5 MG/3ML) 0.083% nebulizer solution Take 6 mLs (5 mg total) by nebulization every 6 (six) hours as needed for wheezing or shortness of breath. 05/03/16   Audry Pili, PA-C  azithromycin (ZITHROMAX) 250 MG tablet Take 1 tablet (250 mg total) by mouth daily. Take first 2 tablets together, then 1 every day until finished. 07/10/17   Mardella Layman, MD  benzonatate (TESSALON) 100 MG capsule Take 1 capsule (100 mg total) by mouth every 8 (eight) hours. Patient not taking: Reported on 07/10/2017 05/03/16   Audry Pili, PA-C  cefdinir (OMNICEF) 300 MG capsule Take 1 capsule (300 mg total) by mouth 2 (two) times daily for 10 days. 12/04/17 12/14/17  Dahlia Byes A, NP  loperamide  (IMODIUM) 2 MG capsule Take 1 capsule (2 mg total) by mouth 4 (four) times daily as needed for diarrhea or loose stools. Patient not taking: Reported on 04/30/2016 11/02/15   Ward, Layla Maw, DO  magic mouthwash SOLN Take 5 mLs by mouth 4 (four) times daily as needed for mouth pain. 12/04/17   Bast, Gloris Manchester A, NP  ondansetron (ZOFRAN ODT) 4 MG disintegrating tablet Take 1 tablet (4 mg total) by mouth every 8 (eight) hours as needed for nausea or vomiting. Patient not taking: Reported on 04/30/2016 11/02/15   Ward, Layla Maw, DO  predniSONE (DELTASONE) 20 MG tablet Take 2 tablets (40 mg total) by mouth daily. Patient not taking: Reported on 07/10/2017 04/30/16   Barrett Henle, PA-C  promethazine-dextromethorphan (PROMETHAZINE-DM) 6.25-15 MG/5ML syrup Take 5 mLs by mouth 4 (four) times daily as needed for cough. Patient not taking: Reported on 07/10/2017 04/30/16   Barrett Henle, PA-C  sulfamethoxazole-trimethoprim (BACTRIM DS,SEPTRA DS) 800-160 MG tablet Take 1 tablet by mouth 2 (two) times daily.    [provider]    Family History No family history on file.  Social History Social History   Tobacco Use  . Smoking status: Never Smoker  . Smokeless tobacco: Never Used  Substance Use Topics  . Alcohol use: Yes    Comment: socially  . Drug use: No     Allergies   Amoxicillin   Review of Systems Review of Systems  All other systems reviewed and are negative.  Physical Exam Updated Vital Signs BP 137/83 (BP Location: Right Wrist)   Pulse 74   Temp (!) 101.3 F (38.5 C) (Oral)   Resp 16   Ht 5\' 10"  (1.778 m)   SpO2 98%   BMI 33.72 kg/m   Physical Exam  Constitutional: He is oriented to person, place, and time. He appears well-developed and well-nourished.  HENT:  Head: Normocephalic and atraumatic.  Several ulcerations noted to the soft palate and posterior oropharynx, bilateral tonsils and oropharynx slightly erythematous, no significant exudate,  no pooling of secretions or significant swelling.  Eyes: Pupils are equal, round, and reactive to light. Conjunctivae are normal. Right eye exhibits no discharge. Left eye exhibits no discharge. No scleral icterus.  Neck: Normal range of motion. No JVD present. No tracheal deviation present.  Bilateral tender cervical lymphadenopathy  Pulmonary/Chest: Effort normal and breath sounds normal. No stridor. No respiratory distress. He has no wheezes. He has no rales. He exhibits no tenderness.  Neurological: He is alert and oriented to person, place, and time. Coordination normal.  Skin:  No rashes  Psychiatric: He has a normal mood and affect. His behavior is normal. Judgment and thought content normal.  Nursing note and vitals reviewed.    ED Treatments / Results  Labs (all labs ordered are listed, but only abnormal results are displayed) Labs Reviewed - No data to display  EKG None  Radiology No results found.  Procedures Procedures (including critical care time)  Medications Ordered in ED Medications  acetaminophen (TYLENOL) tablet 650 mg (650 mg Oral Given 12/05/17 2007)     Initial Impression / Assessment and Plan / ED Course  I have reviewed the triage vital signs and the nursing notes.  Pertinent labs & imaging results that were available during my care of the patient were reviewed by me and considered in my medical decision making (see chart for details).     Labs:   Imaging:  Consults:  Therapeutics:  Discharge Meds:   Assessment/Plan: 27 year old male presents today with likely hand-foot-and-mouth disease. Patient has Classic oral symptoms, fever and negative strep test. His child at home is sick with similar symptoms. Patient will continue using symptomatic care, ibuprofen and Tylenol, he will return if his fever persists, or if any new or worsening signs or symptoms present. Patient verbalized understanding and agreement to today's plan had no further  questions or concerns.      Final Clinical Impressions(s) / ED Diagnoses   Final diagnoses:  Hand, foot and mouth disease    ED Discharge Orders    None       Rosalio LoudHedges, Mishel Sans, PA-C 12/05/17 2203    Melene PlanFloyd, Dan, DO 12/05/17 2222

## 2017-12-05 NOTE — ED Triage Notes (Signed)
Pt reports he has mouth "sores" appear on his mouth on Monday, yesterday he went to urgent care, took 3-4 doses of antibiotics and mouthwash but it continues to get worse.  Pt also has a fever, confirms chills and "shakes"

## 2017-12-05 NOTE — ED Notes (Signed)
Patient verbalizes understanding of discharge instructions. Opportunity for questioning and answers were provided. Armband removed by staff, pt discharged from ED ambulatory.   

## 2017-12-06 LAB — CULTURE, GROUP A STREP (THRC)

## 2019-03-22 ENCOUNTER — Other Ambulatory Visit: Payer: Self-pay

## 2019-03-22 ENCOUNTER — Encounter (HOSPITAL_COMMUNITY): Payer: Self-pay | Admitting: Emergency Medicine

## 2019-03-22 ENCOUNTER — Ambulatory Visit (HOSPITAL_COMMUNITY)
Admission: EM | Admit: 2019-03-22 | Discharge: 2019-03-22 | Disposition: A | Discharge status: Home / Self Care | Payer: PRIVATE HEALTH INSURANCE | Attending: Family Medicine | Admitting: Family Medicine

## 2019-03-22 DIAGNOSIS — R21 Rash and other nonspecific skin eruption: Secondary | ICD-10-CM

## 2019-03-22 HISTORY — DX: Dermatitis, unspecified: L30.9

## 2019-03-22 MED ORDER — TRIAMCINOLONE ACETONIDE 0.1 % EX CREA: 1.0000 "application " | TOPICAL_CREAM | Freq: Two times a day (BID) | CUTANEOUS | 0 refills | Status: DC

## 2019-03-22 NOTE — ED Provider Notes (Signed)
[facility]    CSN: [id number] Arrival date & time: 03/22/19  1011      History   Chief Complaint Chief Complaint  Patient presents with  . Rash    HPI Billy Perry is a 28 y.o. male.   HPI  Patient has a fine rash around his trunk.  It itches off and on.  He has noticed it 3 days ago.  No new soap or lotion powder or product.  No new exposures at work.  No new medicines or supplements.  No new foods.  No one else in the house has a rash.  No history of eczema or sensitive skin condition Patient states his wife had coronavirus about 3 weeks ago.  She had a mild flu.  She completely recovered.  He never had symptoms.  I told him if it has been more than 14 days since his wife's illness and it is unlikely that he will become sick.  He declines coronavirus testing.  Past Medical History:  Diagnosis Date  . Asthma   . Eczema     There are no active problems to display for this patient.   History reviewed. No pertinent surgical history.     Home Medications    Prior to Admission medications   Medication Sig Start Date End Date Taking? Authorizing Provider  albuterol (PROVENTIL) (2.5 MG/3ML) 0.083% nebulizer solution Take 6 mLs (5 mg total) by nebulization every 6 (six) hours as needed for wheezing or shortness of breath. 05/03/16   Audry Pili, PA-C  triamcinolone cream (KENALOG) 0.1 % Apply 1 application topically 2 (two) times daily. 03/22/19   Eustace Moore, MD    Family History Family History  Problem Relation Age of Onset  . Hypertension Mother     Social History Social History   Tobacco Use  . Smoking status: Never Smoker  . Smokeless tobacco: Never Used  Substance Use Topics  . Alcohol use: Yes    Comment: socially  . Drug use: No     Allergies   Amoxicillin   Review of Systems Review of Systems  Constitutional: Negative for chills and fever.  HENT: Negative for ear pain and sore throat.   Eyes: Negative for pain and  visual disturbance.  Respiratory: Negative for cough and shortness of breath.   Cardiovascular: Negative for chest pain and palpitations.  Gastrointestinal: Negative for abdominal pain and vomiting.  Genitourinary: Negative for dysuria and hematuria.  Musculoskeletal: Negative for arthralgias and back pain.  Skin: Positive for rash. Negative for color change.  Neurological: Negative for seizures and syncope.  All other systems reviewed and are negative.    Physical Exam Triage Vital Signs ED Triage Vitals  Enc Vitals Group     BP 03/22/19 1058 127/84     Pulse Rate 03/22/19 1058 74     Resp 03/22/19 1058 18     Temp 03/22/19 1058 98.4 F (36.9 C)     Temp Source 03/22/19 1058 Oral     SpO2 03/22/19 1058 96 %     Weight --      Height --      Head Circumference --      Peak Flow --      Pain Score 03/22/19 1054 0     Pain Loc --      Pain Edu? --      Excl. in [place]? --    No data found.  Updated Vital Signs BP 127/84 (BP Location: Right  Arm) Comment (BP Location): large cuff  Pulse 74   Temp 98.4 F (36.9 C) (Oral)   Resp 18   SpO2 96%      Physical Exam Constitutional:      General: He is not in acute distress.    Appearance: He is well-developed.  HENT:     Head: Normocephalic and atraumatic.     Mouth/Throat:     Comments: Mask in place Eyes:     Conjunctiva/sclera: Conjunctivae normal.     Pupils: Pupils are equal, round, and reactive to light.  Neck:     Musculoskeletal: Normal range of motion.  Cardiovascular:     Rate and Rhythm: Normal rate.     Heart sounds: Normal heart sounds.  Pulmonary:     Effort: Pulmonary effort is normal. No respiratory distress.     Breath sounds: Normal breath sounds.  Abdominal:     General: There is no distension.     Palpations: Abdomen is soft.  Musculoskeletal: Normal range of motion.  Skin:    General: Skin is warm and dry.     Findings: Rash present.     Comments: Fine pinpoint papular rash around both  flanks, sides.  Spares chest and back.  Neurological:     Mental Status: He is alert.      UC Treatments / Results  Labs (all labs ordered are listed, but only abnormal results are displayed) Labs Reviewed - No data to display  EKG   Radiology No results found.  Procedures Procedures (including critical care time)  Medications Ordered in UC Medications - No data to display  Initial Impression / Assessment and Plan / UC Course  I have reviewed the triage vital signs and the nursing notes.  Pertinent labs & imaging results that were available during my care of the patient were reviewed by me and considered in my medical decision making (see chart for details).     Rash looks like it is irritative, allergic versus infectious.  No vesicles or contact dermatitis.  Will treat with cortisone cream and SPECT improvement Final Clinical Impressions(s) / UC Diagnoses   Final diagnoses:  Rash     Discharge Instructions     Apply the cortisone cream 2 times a day and rub in thoroughly You may take Benadryl for itching at night, if needed During the day if you have itching take either Claritin or Zyrtec You should start to see improvement within a couple of days Call for problems   ED Prescriptions    Medication Sig Dispense Auth. Provider   triamcinolone cream (KENALOG) 0.1 % Apply 1 application topically 2 (two) times daily. 30 g Raylene Everts, MD     PDMP not reviewed this encounter.   Raylene Everts, MD 03/22/19 [phone]

## 2019-03-22 NOTE — ED Triage Notes (Signed)
Rash to trunk, intermittently itches.  Noticed rash on Friday  Wife positive for covid 2-3 weeks ago.

## 2019-03-22 NOTE — Discharge Instructions (Addendum)
Apply the cortisone cream 2 times a day and rub in thoroughly You may take Benadryl for itching at night, if needed During the day if you have itching take either Claritin or Zyrtec You should start to see improvement within a couple of days Call for problems

## 2019-06-15 ENCOUNTER — Other Ambulatory Visit: Payer: Self-pay

## 2019-06-15 ENCOUNTER — Ambulatory Visit (HOSPITAL_COMMUNITY)
Admission: EM | Admit: 2019-06-15 | Discharge: 2019-06-15 | Disposition: A | Discharge status: Home / Self Care | Payer: PRIVATE HEALTH INSURANCE | Attending: Internal Medicine | Admitting: Internal Medicine

## 2019-06-15 ENCOUNTER — Encounter (HOSPITAL_COMMUNITY): Payer: Self-pay | Admitting: Emergency Medicine

## 2019-06-15 ENCOUNTER — Ambulatory Visit (INDEPENDENT_AMBULATORY_CARE_PROVIDER_SITE_OTHER): Payer: PRIVATE HEALTH INSURANCE

## 2019-06-15 DIAGNOSIS — S46912A Strain of unspecified muscle, fascia and tendon at shoulder and upper arm level, left arm, initial encounter: Secondary | ICD-10-CM

## 2019-06-15 MED ORDER — CYCLOBENZAPRINE HCL 10 MG PO TABS: 10.0000 mg | ORAL_TABLET | Freq: Two times a day (BID) | ORAL | 0 refills | Status: DC | PRN

## 2019-06-15 MED ORDER — IBUPROFEN 800 MG PO TABS: 800.0000 mg | ORAL_TABLET | Freq: Three times a day (TID) | ORAL | 0 refills | Status: DC

## 2019-06-15 NOTE — Discharge Instructions (Signed)
Take medications as prescribed  Alternate between ice and heat to shoulder at least three times a day  Avoid overuse of arm until symptoms start to improve  Follow-up with orthopedics if no improvement in your symptoms after a week or so

## 2019-06-15 NOTE — ED Triage Notes (Signed)
PT opened locked door with left shoulder this afternoon.

## 2019-06-15 NOTE — ED Provider Notes (Signed)
[place]    CSN: [id number] Arrival date & time: 06/15/19  1813      History   Chief Complaint Chief Complaint  Patient presents with  . Shoulder Pain    HPI Billy Perry is a 29 y.o. male.   Subjective:  Billy Perry is a 29 y.o. male who presents with left shoulder pain. The symptoms began today. Patient reports that his son accidentally locked himself into a room. He used his upper body and shoulder in particular to open the door. Pain has been worsening throughout the day. Pain is located around the acromioclavicular [facility]) joint and diffusely throughout the shoulder. Discomfort is described as throbbing. Symptoms are exacerbated by repetitive movements. Evaluation to date: none. Therapy to date includes: nothing specific.  The following portions of the patient's history were reviewed and updated as appropriate: allergies, current medications, past family history, past medical history, past social history, past surgical history and problem list.       Past Medical History:  Diagnosis Date  . Asthma   . Eczema     There are no problems to display for this patient.   History reviewed. No pertinent surgical history.     Home Medications    Prior to Admission medications   Medication Sig Start Date End Date Taking? Authorizing Provider  albuterol (PROVENTIL) (2.5 MG/3ML) 0.083% nebulizer solution Take 6 mLs (5 mg total) by nebulization every 6 (six) hours as needed for wheezing or shortness of breath. 05/03/16   Shary Decamp, PA-C  cyclobenzaprine (FLEXERIL) 10 MG tablet Take 1 tablet (10 mg total) by mouth 2 (two) times daily as needed for muscle spasms. 06/15/19   Enrique Sack, FNP  ibuprofen (ADVIL) 800 MG tablet Take 1 tablet (800 mg total) by mouth 3 (three) times daily. Take 1 tablet by mouth with food three times a day for 5 days then may take as needed 06/15/19   Enrique Sack, FNP  triamcinolone cream (KENALOG) 0.1 % Apply 1  application topically 2 (two) times daily. 03/22/19   Raylene Everts, MD    Family History Family History  Problem Relation Age of Onset  . Hypertension Mother     Social History Social History   Tobacco Use  . Smoking status: Never Smoker  . Smokeless tobacco: Never Used  Substance Use Topics  . Alcohol use: Yes    Comment: socially  . Drug use: No     Allergies   Amoxicillin   Review of Systems Review of Systems  Musculoskeletal: Positive for arthralgias.  All other systems reviewed and are negative.    Physical Exam Triage Vital Signs ED Triage Vitals [06/15/19 1905]  Enc Vitals Group     BP (!) 147/83     Pulse Rate 79     Resp 16     Temp 98.5 F (36.9 C)     Temp Source Oral     SpO2 99 %     Weight      Height      Head Circumference      Peak Flow      Pain Score 6     Pain Loc      Pain Edu?      Excl. in [place]?    No data found.  Updated Vital Signs BP (!) 147/83   Pulse 79   Temp 98.5 F (36.9 C) (Oral)   Resp 16   SpO2 99%   Visual Acuity Right Eye  Distance:   Left Eye Distance:   Bilateral Distance:    Right Eye Near:   Left Eye Near:    Bilateral Near:     Physical Exam Vitals reviewed.  Constitutional:      Appearance: Normal appearance.  HENT:     Head: Normocephalic.  Cardiovascular:     Rate and Rhythm: Normal rate and regular rhythm.  Pulmonary:     Effort: Pulmonary effort is normal.  Musculoskeletal:        General: Normal range of motion.     Right shoulder: Normal.     Left shoulder: Tenderness present. No swelling or deformity. Normal range of motion. Normal strength.     Cervical back: Full passive range of motion without pain and normal range of motion.  Skin:    General: Skin is warm and dry.  Neurological:     General: No focal deficit present.     Mental Status: He is alert.  Psychiatric:        Mood and Affect: Mood normal.      UC Treatments / Results  Labs (all labs ordered are listed,  but only abnormal results are displayed) Labs Reviewed - No data to display  EKG   Radiology DG Shoulder Left  Result Date: 06/15/2019 CLINICAL DATA:  29 year old male with left shoulder pain. EXAM: LEFT SHOULDER - 2+ VIEW COMPARISON:  None. FINDINGS: There is no evidence of fracture or dislocation. There is no evidence of arthropathy or other focal bone abnormality. Soft tissues are unremarkable. IMPRESSION: Negative. Electronically Signed   By: Elgie Collard M.D.   On: 06/15/2019 19:10    Procedures Procedures (including critical care time)  Medications Ordered in UC Medications - No data to display  Initial Impression / Assessment and Plan / UC Course  I have reviewed the triage vital signs and the nursing notes.  Pertinent labs & imaging results that were available during my care of the patient were reviewed by me and considered in my medical decision making (see chart for details).    29 yo male presenting with an acute injury to the left shoulder. X-ray negative for any fracture, dislocation or soft tissue abnormality. Supportive care advised. Natural history and expected course discussed. Agricultural engineer distributed. Reduction in offending activity. Gentle ROM exercises. Rest, ice, compression, and elevation (RICE) therapy. NSAIDs per medication orders. Orthopedic follow-up if no improvement in symptoms.   Today's evaluation has revealed no signs of a dangerous process. Discussed diagnosis with patient and/or guardian. Patient and/or guardian aware of their diagnosis, possible red flag symptoms to watch out for and need for close follow up. Patient and/or guardian understands verbal and written discharge instructions. Patient and/or guardian comfortable with plan and disposition.  Patient and/or guardian has a clear mental status at this time, good insight into illness (after discussion and teaching) and has clear judgment to make decisions regarding their care  This care  was provided during an unprecedented National Emergency due to the Novel Coronavirus (COVID-19) pandemic. COVID-19 infections and transmission risks place heavy strains on healthcare resources.  As this pandemic evolves, our facility, providers, and staff strive to respond fluidly, to remain operational, and to provide care relative to available resources and information. Outcomes are unpredictable and treatments are without well-defined guidelines. Further, the impact of COVID-19 on all aspects of urgent care, including the impact to patients seeking care for reasons other than COVID-19, is unavoidable during this national emergency. At this time of the global pandemic,  management of patients has significantly changed, even for non-COVID positive patients given high local and regional COVID volumes at this time requiring high healthcare system and resource utilization. The standard of care for management of both COVID suspected and non-COVID suspected patients continues to change rapidly at the local, regional, national, and global levels. This patient was worked up and treated to the best available but ever changing evidence and resources available at this current time.   Documentation was completed with the aid of voice recognition software. Transcription may contain typographical errors.   Final Clinical Impressions(s) / UC Diagnoses   Final diagnoses:  Strain of left shoulder, initial encounter     Discharge Instructions     1. Take medications as prescribed  2. Alternate between ice and heat to shoulder at least three times a day  3. Avoid overuse of arm until symptoms start to improve  4. Follow-up with orthopedics if no improvement in your symptoms after a week or so     ED Prescriptions    Medication Sig Dispense Auth. Provider   ibuprofen (ADVIL) 800 MG tablet Take 1 tablet (800 mg total) by mouth 3 (three) times daily. Take 1 tablet by mouth with food three times a day for 5 days  then may take as needed 21 tablet Dillyn Menna, Lelon Mast, FNP   cyclobenzaprine (FLEXERIL) 10 MG tablet Take 1 tablet (10 mg total) by mouth 2 (two) times daily as needed for muscle spasms. 20 tablet Lurline Idol, FNP     PDMP not reviewed this encounter.   Lurline Idol, [place] 06/15/19 1922

## 2019-08-17 ENCOUNTER — Telehealth: Payer: Self-pay | Admitting: General Practice

## 2019-08-17 NOTE — Telephone Encounter (Signed)
LVM re: Rec'd online request to schedule for new patient visit.

## 2019-08-20 ENCOUNTER — Telehealth: Payer: Self-pay | Admitting: General Practice

## 2019-08-20 NOTE — Telephone Encounter (Signed)
LVM RE: Received request from patient to establish as new patient.

## 2019-09-24 ENCOUNTER — Other Ambulatory Visit: Payer: Self-pay

## 2019-09-24 ENCOUNTER — Encounter (HOSPITAL_COMMUNITY): Payer: Self-pay

## 2019-09-24 ENCOUNTER — Ambulatory Visit (INDEPENDENT_AMBULATORY_CARE_PROVIDER_SITE_OTHER)
Admission: RE | Admit: 2019-09-24 | Discharge: 2019-09-24 | Disposition: A | Discharge status: Other Acute Inpatient Hospital | Payer: PRIVATE HEALTH INSURANCE | Source: Ambulatory Visit

## 2019-09-24 ENCOUNTER — Ambulatory Visit (HOSPITAL_COMMUNITY)
Admission: EM | Admit: 2019-09-24 | Discharge: 2019-09-24 | Disposition: A | Discharge status: Home / Self Care | Payer: PRIVATE HEALTH INSURANCE | Attending: Family Medicine | Admitting: Family Medicine

## 2019-09-24 DIAGNOSIS — L089 Local infection of the skin and subcutaneous tissue, unspecified: Secondary | ICD-10-CM

## 2019-09-24 DIAGNOSIS — R5381 Other malaise: Secondary | ICD-10-CM | POA: Insufficient documentation

## 2019-09-24 DIAGNOSIS — B349 Viral infection, unspecified: Secondary | ICD-10-CM | POA: Insufficient documentation

## 2019-09-24 DIAGNOSIS — L03031 Cellulitis of right toe: Secondary | ICD-10-CM | POA: Diagnosis not present

## 2019-09-24 DIAGNOSIS — J45909 Unspecified asthma, uncomplicated: Secondary | ICD-10-CM | POA: Insufficient documentation

## 2019-09-24 DIAGNOSIS — R509 Fever, unspecified: Secondary | ICD-10-CM | POA: Diagnosis present

## 2019-09-24 DIAGNOSIS — Z880 Allergy status to penicillin: Secondary | ICD-10-CM | POA: Insufficient documentation

## 2019-09-24 DIAGNOSIS — Z20822 Contact with and (suspected) exposure to covid-19: Secondary | ICD-10-CM | POA: Insufficient documentation

## 2019-09-24 DIAGNOSIS — R519 Headache, unspecified: Secondary | ICD-10-CM | POA: Diagnosis not present

## 2019-09-24 DIAGNOSIS — Z79899 Other long term (current) drug therapy: Secondary | ICD-10-CM | POA: Insufficient documentation

## 2019-09-24 DIAGNOSIS — L600 Ingrowing nail: Secondary | ICD-10-CM | POA: Diagnosis not present

## 2019-09-24 MED ORDER — PROMETHAZINE-DM 6.25-15 MG/5ML PO SYRP: 5.0000 mL | ORAL_SOLUTION | Freq: Every evening | ORAL | 0 refills | Status: DC | PRN

## 2019-09-24 MED ORDER — BENZONATATE 100 MG PO CAPS: 100.0000 mg | ORAL_CAPSULE | Freq: Three times a day (TID) | ORAL | 0 refills | Status: DC | PRN

## 2019-09-24 MED ORDER — CETIRIZINE HCL 10 MG PO TABS: 10.0000 mg | ORAL_TABLET | Freq: Every day | ORAL | 0 refills | Status: DC

## 2019-09-24 MED ORDER — SULFAMETHOXAZOLE-TRIMETHOPRIM 800-160 MG PO TABS: 1.0000 | ORAL_TABLET | Freq: Two times a day (BID) | ORAL | 0 refills | Status: DC

## 2019-09-24 NOTE — ED Provider Notes (Signed)
[facility]   MRN: [id number] DOB: 1990-12-22  Subjective:   Billy Perry is a 29 y.o. male presenting for several day history of persistent right great toe drainage and swelling.  Started to have fever and headache, general malaise over the past couple of days.  Has not tried medications for relief.  Denies any known direct COVID-19 contacts.  Denies fever, cough, chest pain, shortness of breath, loss of sense of taste and smell.  He does have a history of allergies and has used Zyrtec in the past.  He is not on this daily.  No current facility-administered medications for this encounter.  Current Outpatient Medications:  .  albuterol (PROVENTIL) (2.5 MG/3ML) 0.083% nebulizer solution, Take 6 mLs (5 mg total) by nebulization every 6 (six) hours as needed for wheezing or shortness of breath. (Patient not taking: Reported on 09/24/2019), Disp: 75 mL, Rfl: 1 .  cyclobenzaprine (FLEXERIL) 10 MG tablet, Take 1 tablet (10 mg total) by mouth 2 (two) times daily as needed for muscle spasms. (Patient not taking: Reported on 09/24/2019), Disp: 20 tablet, Rfl: 0 .  ibuprofen (ADVIL) 800 MG tablet, Take 1 tablet (800 mg total) by mouth 3 (three) times daily. Take 1 tablet by mouth with food three times a day for 5 days then may take as needed (Patient not taking: Reported on 09/24/2019), Disp: 21 tablet, Rfl: 0 .  triamcinolone cream (KENALOG) 0.1 %, Apply 1 application topically 2 (two) times daily. (Patient not taking: Reported on 09/24/2019), Disp: 30 g, Rfl: 0   Allergies  Allergen Reactions  . Amoxicillin Hives    Childhood     Past Medical History:  Diagnosis Date  . Asthma   . Eczema      History reviewed. No pertinent surgical history.  Family History  Problem Relation Age of Onset  . Hypertension Mother     Social History   Tobacco Use  . Smoking status: Never Smoker  . Smokeless tobacco: Never Used  Substance Use Topics  . Alcohol use: Yes    Comment: socially   . Drug use: No    ROS   Objective:   Vitals: BP (!) 136/97 (BP Location: Right Arm)   Pulse 86   Temp 98.2 F (36.8 C) (Oral)   Resp 18   Wt 240 lb (108.9 kg)   SpO2 98%   BMI 34.44 kg/m   Physical Exam Constitutional:      General: He is not in acute distress.    Appearance: Normal appearance. He is well-developed. He is not ill-appearing, toxic-appearing or diaphoretic.  HENT:     Head: Normocephalic and atraumatic.     Right Ear: External ear normal.     Left Ear: External ear normal.     Nose: Nose normal.     Mouth/Throat:     Mouth: Mucous membranes are moist.     Pharynx: Oropharynx is clear.  Eyes:     General: No scleral icterus.    Extraocular Movements: Extraocular movements intact.     Pupils: Pupils are equal, round, and reactive to light.  Cardiovascular:     Rate and Rhythm: Normal rate and regular rhythm.     Heart sounds: Normal heart sounds. No murmur. No friction rub. No gallop.   Pulmonary:     Effort: Pulmonary effort is normal. No respiratory distress.     Breath sounds: Normal breath sounds. No stridor. No wheezing, rhonchi or rales.  Musculoskeletal:  Legs:  Neurological:     Mental Status: He is alert and oriented to person, place, and time.     Cranial Nerves: No cranial nerve deficit.     Motor: No weakness.     Coordination: Coordination normal.     Gait: Gait normal.     Deep Tendon Reflexes: Reflexes normal.  Psychiatric:        Mood and Affect: Mood normal.        Behavior: Behavior normal.        Thought Content: Thought content normal.        Judgment: Judgment normal.     Assessment and Plan :   PDMP not reviewed this encounter.  1. Viral illness   2. Ingrown toenail of right foot   3. Ingrown toenail of right foot with infection   4. Fever, unspecified   5. Acute nonintractable headache, unspecified headache type   6. Malaise     Will manage for viral illness such as viral URI, viral syndrome, viral  rhinitis, COVID-19. Counseled patient on nature of COVID-19 including modes of transmission, diagnostic testing, management and supportive care.  Offered symptomatic relief. COVID 19 testing is pending. Start Bactrim for ingrown toe nail that is infected. Referred to [facility]. Counseled patient on potential for adverse effects with medications prescribed/recommended today, ER and return-to-clinic precautions discussed, patient verbalized understanding.     Wallis Bamberg, PA-C 09/24/19 1356

## 2019-09-24 NOTE — Discharge Instructions (Addendum)
We will notify you of your COVID-19 test results as they arrive and may take between 24 to 48 hours.  I encourage you to sign up for MyChart if you have not already done so as this can be the easiest way for [place] to communicate results to you online or through a phone app.  In the meantime, if you develop worsening symptoms including fever, chest pain, shortness of breath despite our current treatment plan then please report to the emergency room as this may be a sign of worsening status from possible COVID-19 infection.  Otherwise, we will manage this as a viral syndrome. For sore throat or cough try using a honey-based tea. Use 3 teaspoons of honey with juice squeezed from half lemon. Place shaved pieces of ginger into 1/2-1 cup of water and warm over stove top. Then mix the ingredients and repeat every 4 hours as needed. Please take Tylenol 500mg -650mg  every 6 hours for aches and pains, fevers. Hydrate very well with at least 2 liters of water. Eat light meals such as soups to replenish electrolytes and soft fruits, veggies. Start an antihistamine like Zyrtec, Allegra or Claritin for postnasal drainage, sinus congestion.  You can take this together with pseudoephedrine (Sudafed) at a dose of 60 mg 2-3 times a day as needed for the same kind of congestion.      COVID-19 info: [email or web address] Get online care: [email or web address] Address: [address] [place], [place], [place] Phone: [phone] Appointments: [email or web address]

## 2019-09-24 NOTE — ED Triage Notes (Signed)
Pt states he has had fever and chills this started yesterday. Pt states his big toe on his right foot maybe infected.

## 2019-09-24 NOTE — ED Provider Notes (Signed)
Virtual Visit via Video Note:  Billy Perry  initiated request for Telemedicine visit with [facility] team. I connected with Billy Perry  on 09/24/2019 at 12:09 PM  for a synchronized telemedicine visit using a video enabled HIPPA compliant telemedicine application. I verified that I am speaking with Billy Perry  using two identifiers. Wallis Bamberg, PA-C  was physically located in a [facility]gent care site and Billy Perry was located at a different location.   The limitations of evaluation and management by telemedicine as well as the availability of in-person appointments were discussed. Patient was informed that he  may incur a bill ( including co-pay) for this virtual visit encounter. Billy Perry  expressed understanding and gave verbal consent to proceed with virtual visit.     History of Present Illness:Billy Perry  is a 29 y.o. male presents with 1 day hx of acute onset with nausea, headache, fever with chills. Highest temperature was 102F. Has swollen and bruised right great toe with pus and drainage coming out of the toe.   ROS  No current facility-administered medications for this encounter.   Current Outpatient Medications  Medication Sig Dispense Refill  . albuterol (PROVENTIL) (2.5 MG/3ML) 0.083% nebulizer solution Take 6 mLs (5 mg total) by nebulization every 6 (six) hours as needed for wheezing or shortness of breath. 75 mL 1  . cyclobenzaprine (FLEXERIL) 10 MG tablet Take 1 tablet (10 mg total) by mouth 2 (two) times daily as needed for muscle spasms. 20 tablet 0  . ibuprofen (ADVIL) 800 MG tablet Take 1 tablet (800 mg total) by mouth 3 (three) times daily. Take 1 tablet by mouth with food three times a day for 5 days then may take as needed 21 tablet 0  . triamcinolone cream (KENALOG) 0.1 % Apply 1 application topically 2 (two) times daily. 30 g 0     Allergies  Allergen Reactions  . Amoxicillin Hives    Childhood       Past Medical History:  Diagnosis Date  . Asthma   . Eczema     No past surgical history on file.    Observations/Objective: Physical Exam Patient's audio did not work, visit done by phone.  Assessment and Plan:  PDMP not reviewed this encounter.  1. Fever, unspecified   2. Toe infection    Recommended in person office visit for evaluation of his fever, illness and toe infection (possible paronychia).   Follow Up Instructions:    I discussed the assessment and treatment plan with the patient. The patient was provided an opportunity to ask questions and all were answered. The patient agreed with the plan and demonstrated an understanding of the instructions.   The patient was advised to call back or seek an in-person evaluation if the symptoms worsen or if the condition fails to improve as anticipated.  I provided 10 minutes of non-face-to-face time during this encounter.    Wallis Bamberg, PA-C  09/24/2019 12:09 PM         Wallis Bamberg, PA-C 09/24/19 1212

## 2019-09-25 LAB — SARS CORONAVIRUS 2 (TAT 6-24 HRS)

## 2019-09-25 LAB — SARS CORONAVIRUS 2 BY RT PCR (DIASORIN): SARS Coronavirus 2: NEGATIVE

## 2019-10-07 ENCOUNTER — Other Ambulatory Visit: Payer: Self-pay

## 2019-10-07 ENCOUNTER — Encounter: Payer: Self-pay | Admitting: Podiatry

## 2019-10-07 ENCOUNTER — Ambulatory Visit (INDEPENDENT_AMBULATORY_CARE_PROVIDER_SITE_OTHER): Payer: PRIVATE HEALTH INSURANCE | Admitting: Podiatry

## 2019-10-07 VITALS — BP 125/86 | HR 64 | Temp 97.6°F | Resp 16

## 2019-10-07 DIAGNOSIS — L600 Ingrowing nail: Secondary | ICD-10-CM

## 2019-10-07 MED ORDER — NEOMYCIN-POLYMYXIN-HC 3.5-10000-1 OT SOLN: OTIC | 0 refills | Status: DC

## 2019-10-07 NOTE — Progress Notes (Signed)
Subjective:   Patient ID: Billy Perry, male   DOB: 29 y.o.   MRN: [id number]   HPI Patient states he has had chronic ingrown toenails on both his big toes both sides and the right one was infected recently and has been on antibiotics and it improved but needs to be fixed and is referred here by family physician. Patient does not smoke likes to be active   Review of Systems  All other systems reviewed and are negative.       Objective:  Physical Exam Vitals and nursing note reviewed.  Constitutional:      Appearance: He is well-developed.  Pulmonary:     Effort: Pulmonary effort is normal.  Musculoskeletal:        General: Normal range of motion.  Skin:    General: Skin is warm.  Neurological:     Mental Status: He is alert.     Neurovascular status intact muscle strength adequate range of motion within normal limits. Patient is found to have incurvated ingrown hallux nails bilateral both medial lateral borders with crusted tissue on the distal lateral side right hallux but no active drainage or redness. They are all painful when pressed and make shoe gear difficult. Patient has good digital perfusion well oriented x3     Assessment:  Chronic ingrown toenail deformity hallux bilateral with history of infection right which appears resolved now     Plan:  H&P reviewed conditions and recommended correction of nail borders. Explained procedure risk and patient signed consent form after review and today I infiltrated each hallux 60 mg like Marcaine mixture sterile prep applied to each big toe and using sterile instrumentation remove the medial lateral borders of each big toe exposed matrix applied phenol 3 applications 30 seconds followed by alcohol lavage sterile dressing. Gave instructions on soaks and applied dressings and encouraged him to leave him on 24 hours but take them off earlier if needed and wrote prescription for drops and encouraged him to call with questions  concerns which may arise

## 2019-10-07 NOTE — Progress Notes (Signed)
   Subjective:    Patient ID: Billy Perry, male    DOB: [birth year], 29 y.o.   MRN: [id number]  HPI    Review of Systems  All other systems reviewed and are negative.      Objective:   Physical Exam        Assessment & Plan:

## 2019-10-07 NOTE — Patient Instructions (Signed)

## 2019-10-12 ENCOUNTER — Encounter: Payer: Self-pay | Admitting: Podiatry

## 2020-01-27 ENCOUNTER — Ambulatory Visit: Payer: Medicaid Other | Admitting: Podiatry

## 2020-01-29 ENCOUNTER — Ambulatory Visit: Payer: Medicaid Other | Admitting: Podiatry

## 2020-04-06 ENCOUNTER — Ambulatory Visit: Payer: PRIVATE HEALTH INSURANCE | Admitting: Family Medicine

## 2020-06-18 IMAGING — DX DG SHOULDER 2+V*L*
3 series · 3 of 3 positions shown · non-contrast
Comparison: None.

CLINICAL DATA: 29-year-old male with left shoulder pain.

EXAM:
LEFT SHOULDER - 2+ VIEW

[shoulder grashey]
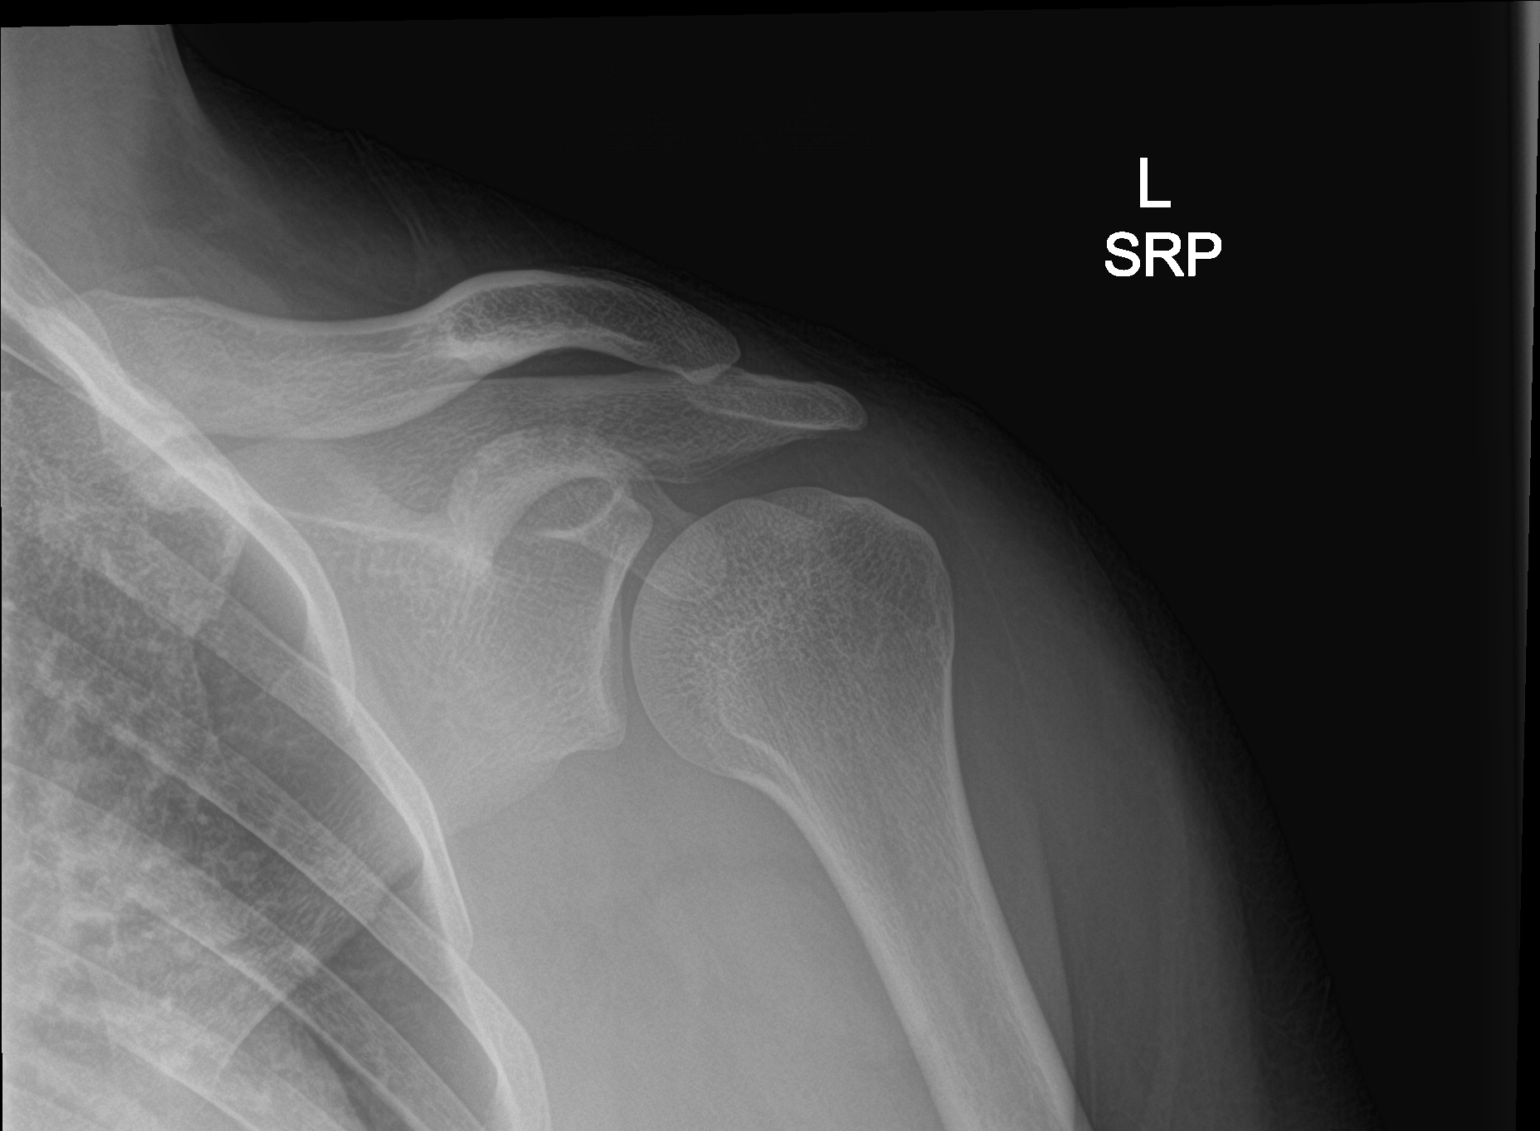

[shoulder y-view]
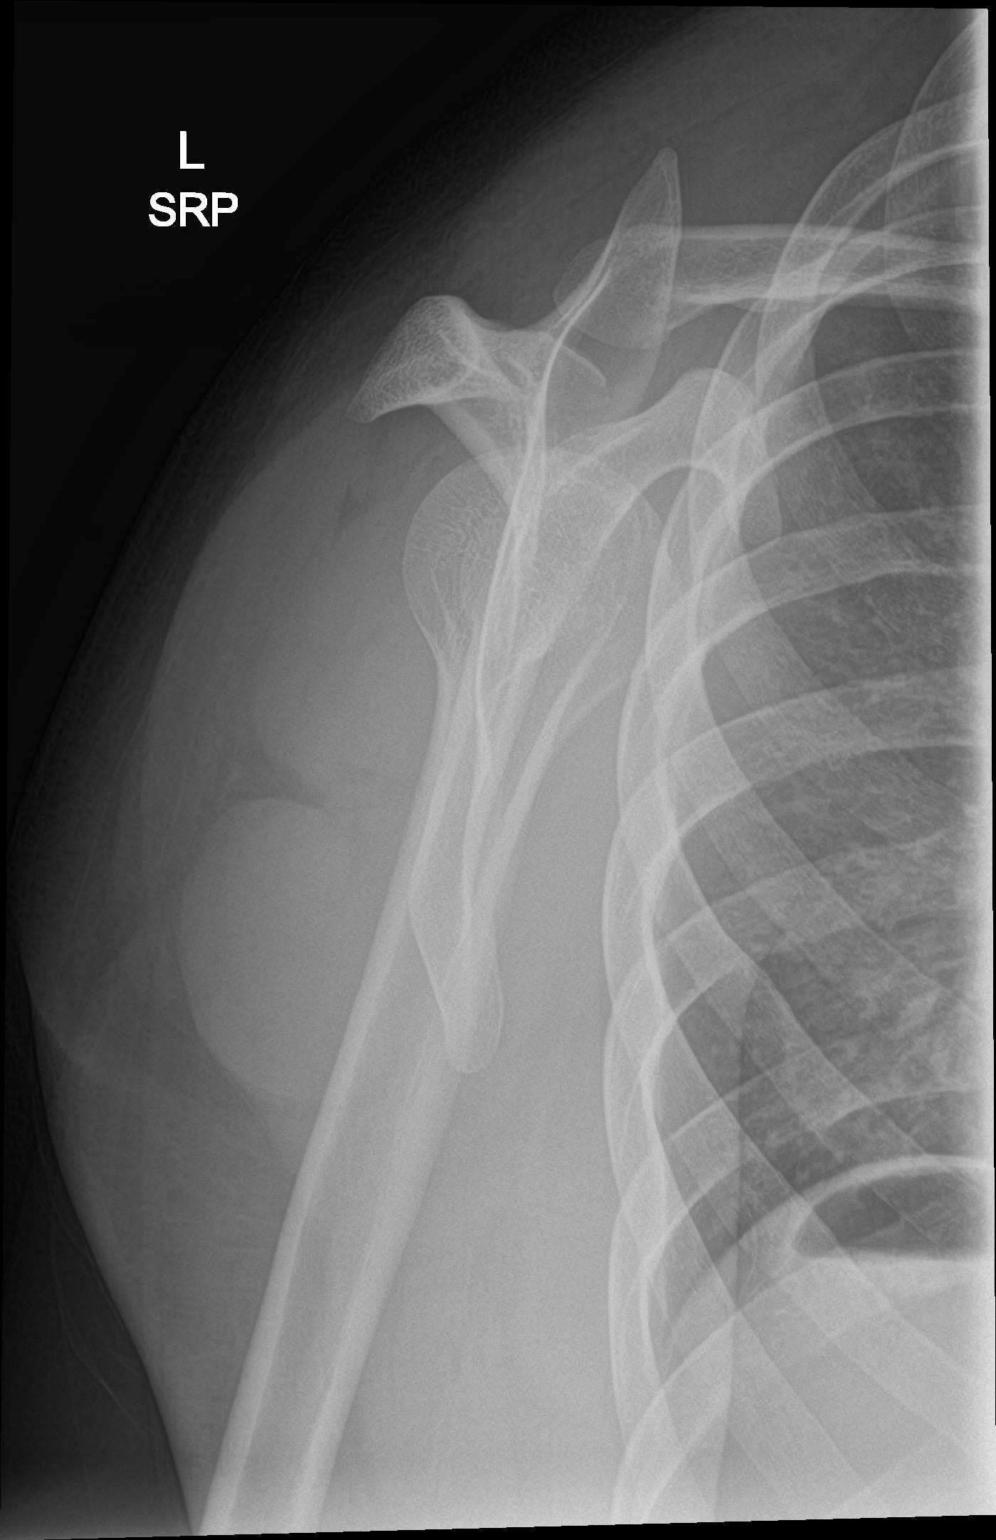

[shoulder axial]
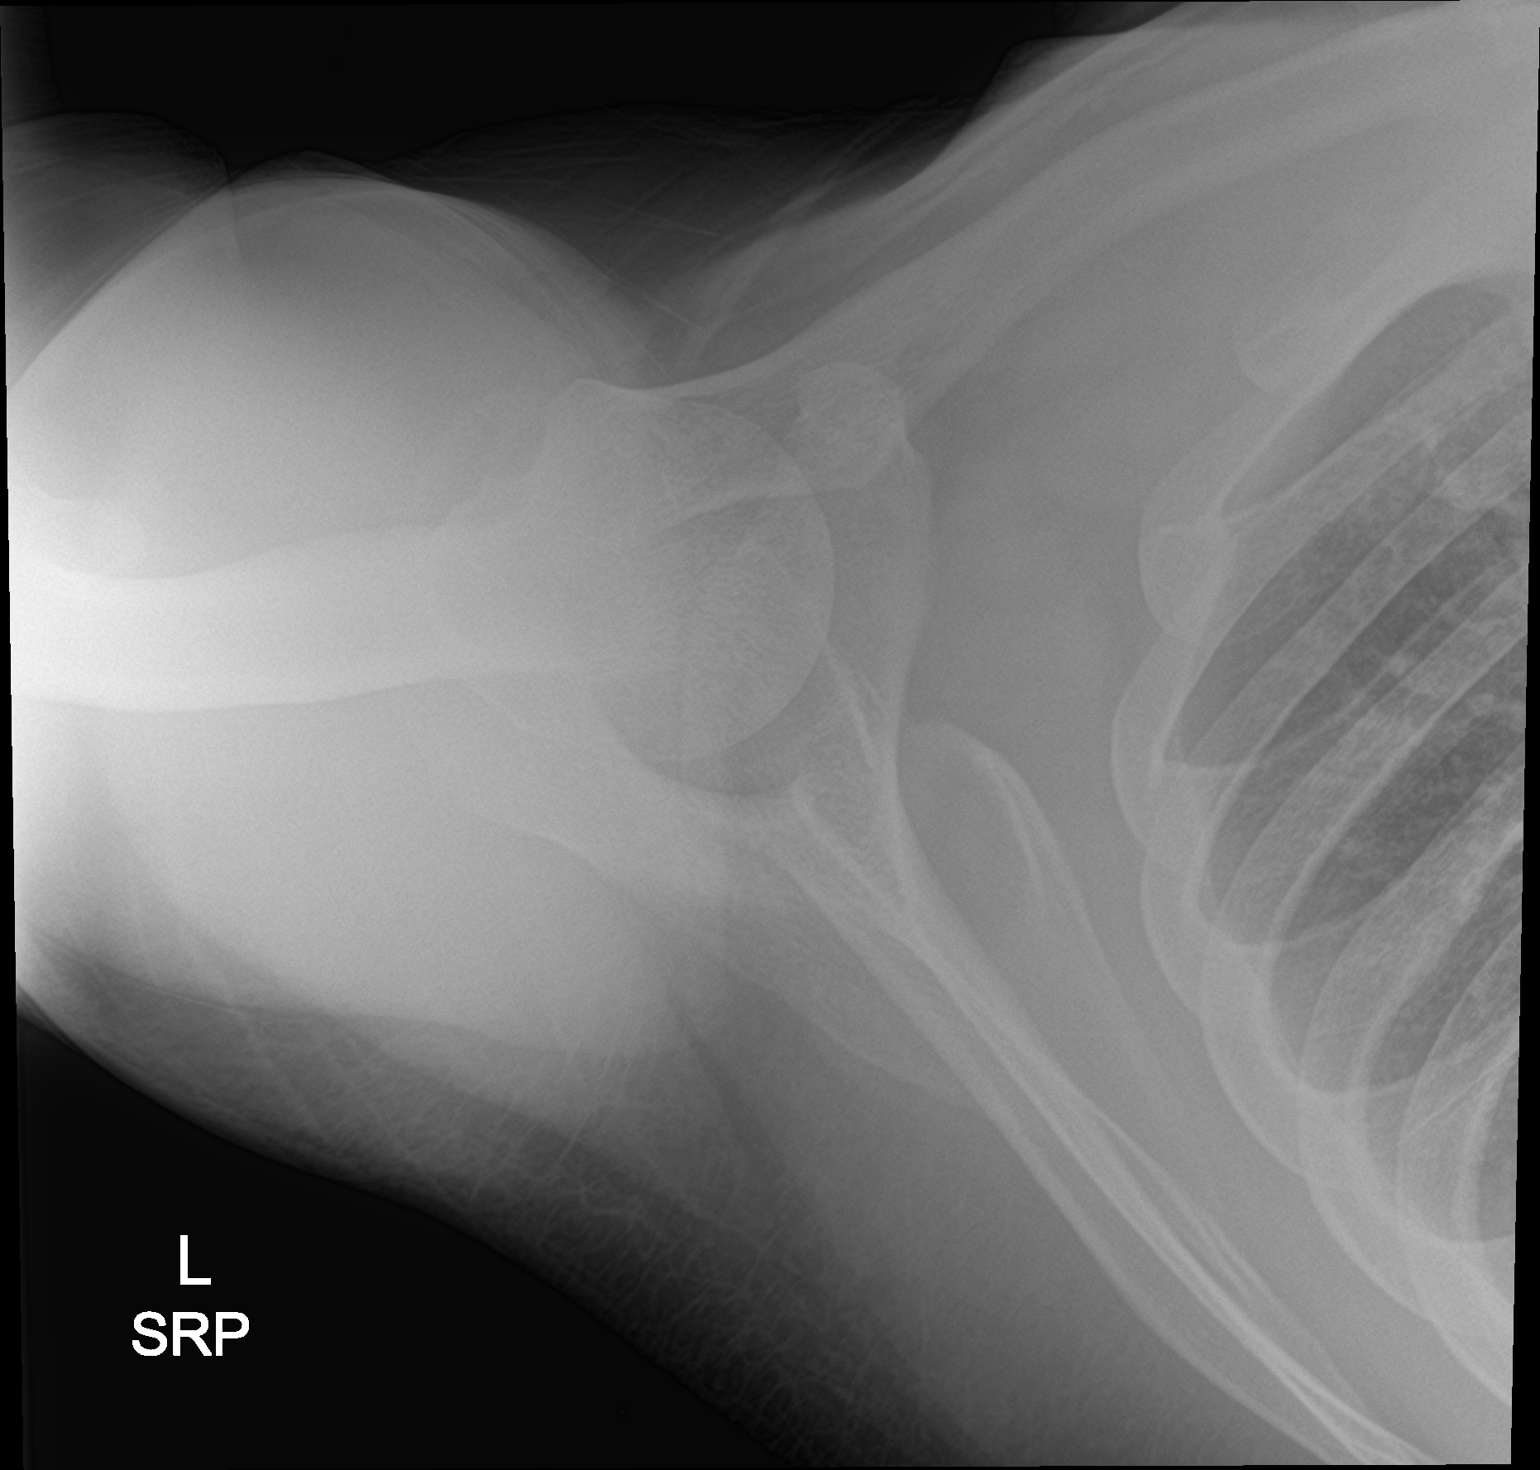

[3 of 3 positions shown; findings below may reference images not displayed]

FINDINGS: There is no evidence of fracture or dislocation. There is no
evidence of arthropathy or other focal bone abnormality. Soft
tissues are unremarkable.
IMPRESSION: Negative.

## 2020-08-01 ENCOUNTER — Ambulatory Visit: Payer: PRIVATE HEALTH INSURANCE | Admitting: Medical-Surgical

## 2020-08-01 DIAGNOSIS — Z7689 Persons encountering health services in other specified circumstances: Secondary | ICD-10-CM

## 2020-08-16 ENCOUNTER — Telehealth (HOSPITAL_BASED_OUTPATIENT_CLINIC_OR_DEPARTMENT_OTHER): Payer: Self-pay

## 2020-08-16 NOTE — Telephone Encounter (Signed)
LVMTCB regarding the NP appt that patient scheduled via Mychart on 08/22/20.

## 2020-08-22 ENCOUNTER — Ambulatory Visit (HOSPITAL_BASED_OUTPATIENT_CLINIC_OR_DEPARTMENT_OTHER): Payer: PRIVATE HEALTH INSURANCE | Admitting: Nurse Practitioner

## 2020-08-29 ENCOUNTER — Encounter (HOSPITAL_BASED_OUTPATIENT_CLINIC_OR_DEPARTMENT_OTHER): Payer: Self-pay | Admitting: Nurse Practitioner

## 2020-08-31 ENCOUNTER — Encounter: Payer: Self-pay | Admitting: Internal Medicine

## 2020-08-31 ENCOUNTER — Other Ambulatory Visit: Payer: Self-pay

## 2020-08-31 ENCOUNTER — Ambulatory Visit (INDEPENDENT_AMBULATORY_CARE_PROVIDER_SITE_OTHER): Payer: Medicaid Other | Admitting: Internal Medicine

## 2020-08-31 VITALS — BP 131/85 | HR 61 | Temp 97.5°F | Resp 20 | Ht 72.0 in | Wt 257.0 lb

## 2020-08-31 DIAGNOSIS — R0683 Snoring: Secondary | ICD-10-CM | POA: Diagnosis not present

## 2020-08-31 DIAGNOSIS — M545 Low back pain, unspecified: Secondary | ICD-10-CM

## 2020-08-31 DIAGNOSIS — G8929 Other chronic pain: Secondary | ICD-10-CM | POA: Diagnosis not present

## 2020-08-31 DIAGNOSIS — Z7689 Persons encountering health services in other specified circumstances: Secondary | ICD-10-CM

## 2020-08-31 DIAGNOSIS — E669 Obesity, unspecified: Secondary | ICD-10-CM

## 2020-08-31 NOTE — Patient Instructions (Signed)
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
## 2020-09-08 ENCOUNTER — Other Ambulatory Visit: Payer: Self-pay

## 2020-09-08 ENCOUNTER — Ambulatory Visit (INDEPENDENT_AMBULATORY_CARE_PROVIDER_SITE_OTHER): Payer: Medicaid Other | Admitting: Podiatry

## 2020-09-08 DIAGNOSIS — L600 Ingrowing nail: Secondary | ICD-10-CM | POA: Diagnosis not present

## 2020-09-12 ENCOUNTER — Ambulatory Visit: Payer: Medicaid Other | Admitting: Podiatry

## 2020-09-13 ENCOUNTER — Encounter: Payer: Self-pay | Admitting: Podiatry

## 2020-09-13 NOTE — Progress Notes (Signed)
  Subjective:  Patient ID: Billy Perry, male    DOB: Dec 30, 1990,  MRN: [id number]  No chief complaint on file.   30 y.o. male presents with the above complaint.  Patient presents with complaint of right medial ingrown.  Patient states it was painful in the past however has stopped hurting.  Patient was seen by Dr. Charlsie Merles in the past that led to the inguinal be removed and states that it may have grown back.  She has not seen anyone else after that.  She states that it started to become little bit painful however it is not hurting anymore.  She denies any other acute complaints.  She just wanted to make sure that there is absolutely nothing else going on with the nail.   Review of Systems: Negative except as noted in the HPI. Denies N/V/F/Ch.  Past Medical History:  Diagnosis Date  . Asthma   . Eczema     Current Outpatient Medications:  .  cetirizine (ZYRTEC ALLERGY) 10 MG tablet, Take 1 tablet (10 mg total) by mouth daily., Disp: 30 tablet, Rfl: 0 .  cyclobenzaprine (FLEXERIL) 10 MG tablet, Take 1 tablet (10 mg total) by mouth 2 (two) times daily as needed for muscle spasms. (Patient not taking: Reported on 08/31/2020), Disp: 20 tablet, Rfl: 0 .  triamcinolone cream (KENALOG) 0.1 %, Apply 1 application topically 2 (two) times daily., Disp: 30 g, Rfl: 0  Social History   Tobacco Use  Smoking Status Never Smoker  Smokeless Tobacco Never Used    Allergies  Allergen Reactions  . Amoxicillin Hives    Childhood    Objective:  There were no vitals filed for this visit. There is no height or weight on file to calculate BMI. Constitutional Well developed. Well nourished.  Vascular Dorsalis pedis pulses palpable bilaterally. Posterior tibial pulses palpable bilaterally. Capillary refill normal to all digits.  No cyanosis or clubbing noted. Pedal hair growth normal.  Neurologic Normal speech. Oriented to person, place, and time. Epicritic sensation to light touch grossly  present bilaterally.  Dermatologic Painful ingrowing nail at medial nail borders of the hallux nail right. No other open wounds. No skin lesions.  Orthopedic: Normal joint ROM without pain or crepitus bilaterally. No visible deformities. No bony tenderness.   Radiographs: None Assessment:   1. Ingrown right big toenail    Plan:  Patient was evaluated and treated and all questions answered.  Ingrown Nail, right -I discussed with the patient the etiology of ingrown toenail and various treatment options were extensively discussed.  Given that is not hurting her anymore I will hold off on doing an ingrown nail procedure.  If it ever bothers her the future come back to see me right.  She states understanding and will do so immediately. -For now we will continue to monitor the anger on.  No follow-ups on file.

## 2020-09-19 DIAGNOSIS — M545 Low back pain, unspecified: Secondary | ICD-10-CM | POA: Insufficient documentation

## 2020-10-11 ENCOUNTER — Encounter: Payer: PRIVATE HEALTH INSURANCE | Admitting: Internal Medicine

## 2020-10-17 ENCOUNTER — Other Ambulatory Visit: Payer: Self-pay

## 2020-10-17 ENCOUNTER — Ambulatory Visit (INDEPENDENT_AMBULATORY_CARE_PROVIDER_SITE_OTHER): Payer: Medicaid Other | Admitting: Internal Medicine

## 2020-10-17 ENCOUNTER — Encounter: Payer: Self-pay | Admitting: Internal Medicine

## 2020-10-17 VITALS — BP 124/79 | HR 66 | Temp 97.5°F | Resp 16 | Ht 72.0 in | Wt 250.0 lb

## 2020-10-17 DIAGNOSIS — Z0000 Encounter for general adult medical examination without abnormal findings: Secondary | ICD-10-CM

## 2020-10-17 DIAGNOSIS — Z131 Encounter for screening for diabetes mellitus: Secondary | ICD-10-CM

## 2020-10-17 DIAGNOSIS — Z130 Encounter for screening for diseases of the blood and blood-forming organs and certain disorders involving the immune mechanism: Secondary | ICD-10-CM

## 2020-10-17 DIAGNOSIS — Z13228 Encounter for screening for other metabolic disorders: Secondary | ICD-10-CM | POA: Diagnosis not present

## 2020-10-17 DIAGNOSIS — Z1159 Encounter for screening for other viral diseases: Secondary | ICD-10-CM | POA: Diagnosis not present

## 2020-10-17 NOTE — Patient Instructions (Signed)
Preventive Care 21-30 Years Old, Male Preventive care refers to lifestyle choices and visits with your health care provider that can promote health and wellness. This includes:  A yearly physical exam. This is also called an annual wellness visit.  Regular dental and eye exams.  Immunizations.  Screening for certain conditions.  Healthy lifestyle choices, such as: ? Eating a healthy diet. ? Getting regular exercise. ? Not using drugs or products that contain nicotine and tobacco. ? Limiting alcohol use. What can I expect for my preventive care visit? Physical exam Your health care provider may check your:  Height and weight. These may be used to calculate your BMI (body mass index). BMI is a measurement that tells if you are at a healthy weight.  Heart rate and blood pressure.  Body temperature.  Skin for abnormal spots. Counseling Your health care provider may ask you questions about your:  Past medical problems.  Family's medical history.  Alcohol, tobacco, and drug use.  Emotional well-being.  Home life and relationship well-being.  Sexual activity.  Diet, exercise, and sleep habits.  Work and work environment.  Access to firearms. What immunizations do I need? Vaccines are usually given at various ages, according to a schedule. Your health care provider will recommend vaccines for you based on your age, medical history, and lifestyle or other factors, such as travel or where you work.   What tests do I need? Blood tests  Lipid and cholesterol levels. These may be checked every 5 years starting at age 20.  Hepatitis C test.  Hepatitis B test. Screening  Diabetes screening. This is done by checking your blood sugar (glucose) after you have not eaten for a while (fasting).  Genital exam to check for testicular cancer or hernias.  STD (sexually transmitted disease) testing, if you are at risk. Talk with your health care provider about your test results,  treatment options, and if necessary, the need for more tests.   Follow these instructions at home: Eating and drinking  Eat a healthy diet that includes fresh fruits and vegetables, whole grains, lean protein, and low-fat dairy products.  Drink enough fluid to keep your urine pale yellow.  Take vitamin and mineral supplements as recommended by your health care provider.  Do not drink alcohol if your health care provider tells you not to drink.  If you drink alcohol: ? Limit how much you have to 0-2 drinks a day. ? Be aware of how much alcohol is in your drink. In the [place] one drink equals one 12 oz bottle of beer (355 mL), one 5 oz glass of wine (148 mL), or one 1 oz glass of hard liquor (44 mL).   Lifestyle  Take daily care of your teeth and gums. Brush your teeth every morning and night with fluoride toothpaste. Floss one time each day.  Stay active. Exercise for at least 30 minutes 5 or more days each week.  Do not use any products that contain nicotine or tobacco, such as cigarettes, e-cigarettes, and chewing tobacco. If you need help quitting, ask your health care provider.  Do not use drugs.  If you are sexually active, practice safe sex. Use a condom or other form of protection to prevent STIs (sexually transmitted infections).  Find healthy ways to cope with stress, such as: ? Meditation, yoga, or listening to music. ? Journaling. ? Talking to a trusted person. ? Spending time with friends and family. Safety  Always wear your seat belt while driving   or riding in a vehicle.  Do not drive: ? If you have been drinking alcohol. Do not ride with someone who has been drinking. ? When you are tired or distracted. ? While texting.  Wear a helmet and other protective equipment during sports activities.  If you have firearms in your house, make sure you follow all gun safety procedures.  Seek help if you have been physically or sexually abused. What's next?  Go to your  health care provider once a year for an annual wellness visit.  Ask your health care provider how often you should have your eyes and teeth checked.  Stay up to date on all vaccines. This information is not intended to replace advice given to you by your health care provider. Make sure you discuss any questions you have with your health care provider. Document Revised: 01/28/2019 Document Reviewed: 05/08/2018 Elsevier Patient Education  2021 [employer]  

## 2020-10-17 NOTE — Progress Notes (Signed)
Subjective:    Billy Perry - 30 y.o. male MRN [id number]  Date of birth: 10/31/1990  HPI  Billy Perry is here for annual exam. His wife is with him today. Would like to be screened for DM due to significant family history.   He has recently stopped working and his wife has started working. Feels like his mood has suffered a little bit with this big change.    Depression screen [facility] 2/9 10/17/2020 08/31/2020  Decreased Interest 0 0  Down, Depressed, Hopeless 2 0  PHQ - 2 Score 2 0  Altered sleeping 2 2  Tired, decreased energy 2 0  Change in appetite 2 0  Feeling bad or failure about yourself  2 1  Trouble concentrating 0 0  Moving slowly or fidgety/restless 0 0  Suicidal thoughts 0 0  PHQ-9 Score 10 3     Health Maintenance:  Health Maintenance Due  Topic Date Due  . COVID-19 Vaccine (1) Never done  . Hepatitis C Screening  Never done    -  reports that he has never smoked. He has never used smokeless tobacco. - Review of Systems: Per HPI. - Past Medical History: There are no problems to display for this patient.  - Medications: reviewed and updated   Objective:   Physical Exam BP 124/79 (BP Location: Left Arm, Patient Position: Sitting, Cuff Size: Large)   Pulse 66   Temp (!) 97.5 F (36.4 C)   Resp 16   Ht 6' (1.829 m)   Wt 250 lb (113.4 kg)   SpO2 96%   BMI 33.91 kg/m  Physical Exam Constitutional:      Appearance: He is not diaphoretic.  HENT:     Head: Normocephalic and atraumatic.  Eyes:     Conjunctiva/sclera: Conjunctivae normal.     Pupils: Pupils are equal, round, and reactive to light.  Neck:     Thyroid: No thyromegaly.  Cardiovascular:     Rate and Rhythm: Normal rate and regular rhythm.     Heart sounds: Normal heart sounds. No murmur heard.   Pulmonary:     Effort: Pulmonary effort is normal. No respiratory distress.     Breath sounds: Normal breath sounds. No wheezing.  Abdominal:     General: Bowel sounds are normal.  There is no distension.     Palpations: Abdomen is soft.     Tenderness: There is no abdominal tenderness. There is no guarding or rebound.  Musculoskeletal:        General: No deformity. Normal range of motion.     Cervical back: Normal range of motion and neck supple.  Lymphadenopathy:     Cervical: No cervical adenopathy.  Skin:    General: Skin is warm and dry.     Findings: No rash.  Neurological:     Mental Status: He is alert and oriented to person, place, and time.     Gait: Gait is intact.  Psychiatric:        Mood and Affect: Mood and affect normal.        Judgment: Judgment normal.            Assessment & Plan:   1. Encounter for annual physical exam Counseled on 150 minutes of exercise per week, healthy eating (including decreased daily intake of saturated fats, cholesterol, added sugars, sodium), STI prevention, routine healthcare maintenance.  2. Screening for metabolic disorder - Comprehensive metabolic panel - Lipid panel  3. Screening for deficiency anemia -  CBC  4. Need for hepatitis C screening test - Hepatitis C antibody  5. Screening for diabetes mellitus - Hemoglobin A1c   Marcy Siren, D.O. 10/17/2020, 9:35 AM Primary Care at [facility]

## 2020-10-17 NOTE — Progress Notes (Signed)
physical

## 2020-10-18 LAB — CBC
Hematocrit: 47.6 % (ref 37.5–51.0)
Hemoglobin: 16.2 g/dL (ref 13.0–17.7)
MCH: 29.5 pg (ref 26.6–33.0)
MCHC: 34 g/dL (ref 31.5–35.7)
MCV: 87 fL (ref 79–97)
Platelets: 231 10*3/uL (ref 150–450)
RBC: 5.5 x10E6/uL (ref 4.14–5.80)
RDW: 12.6 % (ref 11.6–15.4)
WBC: 4.9 10*3/uL (ref 3.4–10.8)

## 2020-10-18 LAB — COMPREHENSIVE METABOLIC PANEL
ALT: 34 IU/L (ref 0–44)
AST: 26 IU/L (ref 0–40)
Albumin/Globulin Ratio: 1.7 (ref 1.2–2.2)
Albumin: 4.6 g/dL (ref 4.1–5.2)
Alkaline Phosphatase: 82 IU/L (ref 44–121)
BUN/Creatinine Ratio: 8 — ABNORMAL LOW (ref 9–20)
BUN: 8 mg/dL (ref 6–20)
Bilirubin Total: 0.6 mg/dL (ref 0.0–1.2)
CO2: 23 mmol/L (ref 20–29)
Calcium: 9.5 mg/dL (ref 8.7–10.2)
Chloride: 104 mmol/L (ref 96–106)
Creatinine, Ser: 0.97 mg/dL (ref 0.76–1.27)
Globulin, Total: 2.7 g/dL (ref 1.5–4.5)
Glucose: 96 mg/dL (ref 65–99)
Potassium: 4.2 mmol/L (ref 3.5–5.2)
Sodium: 141 mmol/L (ref 134–144)
Total Protein: 7.3 g/dL (ref 6.0–8.5)
eGFR: 108 mL/min/{1.73_m2} (ref >59)

## 2020-10-18 LAB — LIPID PANEL
Chol/HDL Ratio: 3.7 ratio (ref 0.0–5.0)
Cholesterol, Total: 108 mg/dL (ref 100–199)
HDL: 29 mg/dL — ABNORMAL LOW (ref >39)
LDL Chol Calc (NIH): 58 mg/dL (ref 0–99)
Triglycerides: 115 mg/dL (ref 0–149)
VLDL Cholesterol Cal: 21 mg/dL (ref 5–40)

## 2020-10-18 LAB — HEPATITIS C ANTIBODY: Hep C Virus Ab: <0.1 s/co ratio (ref 0.0–0.9)

## 2020-10-18 LAB — HEMOGLOBIN A1C

## 2020-10-19 LAB — HEMOGLOBIN A1C
Est. average glucose Bld gHb Est-mCnc: 94 mg/dL
Hgb A1c MFr Bld: 4.9 % (ref 4.8–5.6)

## 2020-10-19 LAB — SPECIMEN STATUS REPORT

## 2020-10-23 ENCOUNTER — Telehealth: Payer: Medicaid Other | Admitting: Physician Assistant

## 2020-10-23 DIAGNOSIS — W57XXXA Bitten or stung by nonvenomous insect and other nonvenomous arthropods, initial encounter: Secondary | ICD-10-CM | POA: Diagnosis not present

## 2020-10-23 DIAGNOSIS — S70369A Insect bite (nonvenomous), unspecified thigh, initial encounter: Secondary | ICD-10-CM

## 2020-10-24 NOTE — Progress Notes (Signed)
Thank you for describing your tick bite, Here is how we plan to help! Based on the information that you shared with me it looks like you have An uncomplicated tick bite that just occurred and can be closely follow using the instructions in your care plan.  In most cases a tick bite is painless and does not itch.  Most tick bites in which the tick is quickly removed do not require prescriptions. Ticks can transmit several diseases if they are infected and remain attacked to your skin. Therefore the length that the tick was attached and any symptoms you have experienced after the bite are import to accurately develop your custom treatment plan. In most cases a single dose of doxycycline may prevent the development of a more serious condition.  Based on your information I have Provided a home care guide for tick bites and  instructions on when to call for help.  Which ticks  are associated with illness?  The Wood Tick (dog tick) is the size of a watermelon seed and can sometimes transmit [place] spotted fever and [place] tick fever.   The Deer Tick (black-legged tick) is between the size of a poppy seed (pin head) and an apple seed, and can sometimes transmit Lyme disease.  A brown to black tick with a white splotch on its back is likely a male Amblyomma americanum ([place] tick). This tick has been associated with Southern Tick Associated illness ( STARI)  Lyme disease has become the most common tick-borne illness in the [place]. The risk of Lyme disease following a recognized deer tick bite is estimated to be 1%.  The majority of cases of Lyme disease start with a bull's eye rash at the site of the tick bite. The rash can occur days to weeks (typically 7-10 days) after a tick bite. Treatment with antibiotics is indicated if this rash appears. Flu-like symptoms may accompany the rash, including: fever, chills, headaches, muscle aches, and fatigue. Removing ticks promptly may  prevent tick borne disease.  What can be used to prevent Tick Bites?   Insect repellant with at leas 20% DEET.  Wearing long pants with sock and shoes.  Avoiding tall grass and heavily wooded areas.  Checking your skin after being outdoors.  Shower with a washcloth after outdoor exposures.  HOME CARE ADVICE FOR TICK BITE  1. Wood Tick Removal:  o Use a pair of tweezers and grasp the wood tick close to the skin (on its head). Pull the wood tick straight upward without twisting or crushing it. Maintain a steady pressure until it releases its grip.   o If tweezers aren't available, use fingers, a loop of thread around the jaws, or a needle between the jaws for traction.  o Note: covering the tick with petroleum jelly, nail polish or rubbing alcohol doesn't work. Neither does touching the tick with a hot or cold object. 2. Tiny Deer Tick Removal:   o Needs to be scraped off with a knife blade or credit card edge. o Place tick in a sealed container (e.g. glass jar, zip lock plastic bag), in case your doctor wants to see it. 3. Tick's Head Removal:  o If the wood tick's head breaks off in the skin, it must be removed. Clean the skin. Then use a sterile needle to uncover the head and lift it out or scrape it off.  o If a very small piece of the head remains, the skin will eventually slough it off.   4. Antibiotic Ointment:  o Wash the wound and your hands with soap and water after removal to prevent catching any tick disease.  Apply an over the counter antibiotic ointment (e.g. bacitracin) to the bite once. 5. Expected Course: Tick bites normally don't itch or hurt. That's why they often go unnoticed. 6. Call Your Doctor If:  o You can't remove the tick or the tick's head o Fever, a severe head ache, or rash occur in the next 2 weeks o Bite begins to look infected o Lyme's disease is common in your area o You have not had a tetanus in the last 10 years o Your current symptoms become worse     MAKE SURE YOU   Understand these instructions.  Will watch your condition.  Will get help right away if you are not doing well or get worse.   Thank you for choosing an e-visit.  Your e-visit answers were reviewed by a board certified advanced clinical practitioner to complete your personal care plan. Depending upon the condition, your plan could have included both over the counter or prescription medications. Please review your pharmacy choice. If there is a problem you may use MyChart messaging to have the prescription routed to another pharmacy. Your safety is important to [place]. If you have drug allergies check your prescription carefully.   You can use MyChart to ask questions about today's visit, request a non-urgent call back, or ask for a work or school excuse for 24 hours related to this e-Visit. If it has been greater than 24 hours you will need to follow up with your provider, or enter a new e-Visit to address those concerns.  You will get an email in the next two days asking about your experience. I hope  that your e-visit has been valuable and will speed your recovery  I provided 5 minutes of non face-to-face time during this encounter for chart review and documentation.

## 2020-11-10 ENCOUNTER — Ambulatory Visit (HOSPITAL_BASED_OUTPATIENT_CLINIC_OR_DEPARTMENT_OTHER): Payer: Medicaid Other | Attending: Internal Medicine | Admitting: Internal Medicine

## 2020-11-10 ENCOUNTER — Other Ambulatory Visit: Payer: Self-pay

## 2020-11-10 DIAGNOSIS — R0683 Snoring: Secondary | ICD-10-CM | POA: Diagnosis not present

## 2020-11-10 DIAGNOSIS — E669 Obesity, unspecified: Secondary | ICD-10-CM | POA: Diagnosis present

## 2020-11-19 DIAGNOSIS — R0683 Snoring: Secondary | ICD-10-CM

## 2020-11-19 NOTE — Procedures (Signed)
   Patient Name: Billy Perry, Billy Perry Date: 11/10/2020 Gender: Male D.O.B: 1991-01-25 Age (years): 30 Referring Provider: De Hollingshead DO Height (inches): 70 Interpreting Physician: Jetty Duhamel MD, ABSM Weight (lbs): 240 RPSGT: Cherylann Parr BMI: 34 MRN: [id number] Neck Size: 18.00  CLINICAL INFORMATION Sleep Study Type: NPSG Indication for sleep study: Snoring, Witnesses Apnea / Gasping During Sleep Epworth Sleepiness Score: 16  SLEEP STUDY TECHNIQUE As per the AASM Manual for the Scoring of Sleep and Associated Events v2.3 (April 2016) with a hypopnea requiring 4% desaturations.  The channels recorded and monitored were frontal, central and occipital EEG, electrooculogram (EOG), submentalis EMG (chin), nasal and oral airflow, thoracic and abdominal wall motion, anterior tibialis EMG, snore microphone, electrocardiogram, and pulse oximetry.  MEDICATIONS Medications self-administered by patient taken the night of the study : none reported  SLEEP ARCHITECTURE The study was initiated at 10:03:08 PM and ended at 4:54:56 AM.  Sleep onset time was 24.1 minutes and the sleep efficiency was 81.6%%. The total sleep time was 336.2 minutes.  Stage REM latency was 61.5 minutes.  The patient spent 9.1%% of the night in stage N1 sleep, 77.0%% in stage N2 sleep, 0.0%% in stage N3 and 14% in REM.  Alpha intrusion was absent.  Supine sleep was 44.24%.  RESPIRATORY PARAMETERS The overall apnea/hypopnea index (AHI) was 2.9 per hour. There were 10 total apneas, including 5 obstructive, 5 central and 0 mixed apneas. There were 6 hypopneas and 0 RERAs.  The AHI during Stage REM sleep was 16.6 per hour.  AHI while supine was 4.4 per hour.  The mean oxygen saturation was 94.3%. The minimum SpO2 during sleep was 81.0%.  moderate snoring was noted during this study.  CARDIAC DATA The 2 lead EKG demonstrated sinus rhythm. The mean heart rate was 51.0 beats per minute. Other EKG  findings include: None.  LEG MOVEMENT DATA The total PLMS were 0 with a resulting PLMS index of 0.0. Associated arousal with leg movement index was 0.0 .  IMPRESSIONS - No significant obstructive sleep apnea occurred during this study (AHI = 2.9/h). - No significant central sleep apnea occurred during this study (CAI = 0.9/h). - Mild oxygen desaturation was noted during this study (Min O2 = 81.0%). Mean O2 saturation 94.5%. - The patient snored with moderate snoring volume. - No cardiac abnormalities were noted during this study. - Clinically significant periodic limb movements did not occur during sleep. No significant associated arousals.  DIAGNOSIS - Primary Snoring  RECOMMENDATIONS - Manage for symptoms based on clinical judgment. - Sleep hygiene should be reviewed to assess factors that may improve sleep quality. - Weight management and regular exercise should be initiated or continued if appropriate.  [Electronically signed] 11/19/2020 10:27 AM  Jetty Duhamel MD, ABSM Diplomate, American Board of Sleep Medicine   NPI: [id number]                          Jetty Duhamel Diplomate, American Board of Sleep Medicine  ELECTRONICALLY SIGNED ON:  11/19/2020, 10:24 AM [facility] PH: [phone]   FX: [phone] ACCREDITED BY THE AMERICAN ACADEMY OF SLEEP MEDICINE

## 2020-11-20 NOTE — Progress Notes (Signed)
No significant obstructive sleep apnea. Continue with sleep hygiene and weight management.

## 2020-12-28 ENCOUNTER — Encounter: Payer: Self-pay | Admitting: Podiatry

## 2021-02-08 ENCOUNTER — Ambulatory Visit: Payer: Medicaid Other | Admitting: Podiatry

## 2021-02-14 ENCOUNTER — Encounter: Payer: Self-pay | Admitting: Family

## 2021-02-15 NOTE — Telephone Encounter (Signed)
Pt contacted to see if he wanted appt w/[facility] for back pain

## 2021-02-26 NOTE — Progress Notes (Signed)
Erroneous encounter

## 2021-03-02 ENCOUNTER — Encounter: Payer: Medicaid Other | Admitting: Family

## 2021-04-04 ENCOUNTER — Telehealth: Payer: Medicaid Other | Admitting: Family Medicine

## 2021-04-04 DIAGNOSIS — R6889 Other general symptoms and signs: Secondary | ICD-10-CM | POA: Diagnosis not present

## 2021-04-04 MED ORDER — OSELTAMIVIR PHOSPHATE 75 MG PO CAPS: 75.0000 mg | ORAL_CAPSULE | Freq: Two times a day (BID) | ORAL | 0 refills | Status: AC

## 2021-04-04 NOTE — Progress Notes (Signed)
E visit for Flu like symptoms   We are sorry that you are not feeling well.  Here is how we plan to help! Based on what you have shared with me it looks like you may have a respiratory virus that may be influenza.  Influenza or "the flu" is   an infection caused by a respiratory virus. The flu virus is highly contagious and persons who did not receive their yearly flu vaccination may "catch" the flu from close contact.  We have anti-viral medications to treat the viruses that cause this infection. They are not a "cure" and only shorten the course of the infection. These prescriptions are most effective when they are given within the first 2 days of "flu" symptoms. Antiviral medication are indicated if you have a high risk of complications from the flu. You should  also consider an antiviral medication if you are in close contact with someone who is at risk. These medications can help patients avoid complications from the flu  but have side effects that you should know. Possible side effects from Tamiflu or oseltamivir include nausea, vomiting, diarrhea, dizziness, headaches, eye redness, sleep problems or other respiratory symptoms. You should not take Tamiflu if you have an allergy to oseltamivir or any to the ingredients in Tamiflu.  Based upon your symptoms and potential risk factors I have prescribed Oseltamivir (Tamiflu).  It has been sent to your designated pharmacy.  You will take one 75 mg capsule orally twice a day for the next 5 days.  ANYONE WHO HAS FLU SYMPTOMS SHOULD: Stay home. The flu is highly contagious and going out or to work exposes others! Be sure to drink plenty of fluids. Water is fine as well as fruit juices, sodas and electrolyte beverages. You may want to stay away from caffeine or alcohol. If you are nauseated, try taking small sips of liquids. How do you know if you are getting enough fluid? Your urine should be a pale yellow or almost colorless. Get rest. Taking a steamy  shower or using a humidifier may help nasal congestion and ease sore throat pain. Using a saline nasal spray works much the same way. Cough drops, hard candies and sore throat lozenges may ease your cough. Line up a caregiver. Have someone check on you regularly.   GET HELP RIGHT AWAY IF: You cannot keep down liquids or your medications. You become short of breath Your fell like you are going to pass out or loose consciousness. Your symptoms persist after you have completed your treatment plan MAKE SURE YOU  Understand these instructions. Will watch your condition. Will get help right away if you are not doing well or get worse.  Your e-visit answers were reviewed by a board certified advanced clinical practitioner to complete your personal care plan.  Depending on the condition, your plan could have included both over the counter or prescription medications.  If there is a problem please reply  once you have received a response from your provider.  Your safety is important to us.  If you have drug allergies check your prescription carefully.    You can use MyChart to ask questions about today's visit, request a non-urgent call back, or ask for a work or school excuse for 24 hours related to this e-Visit. If it has been greater than 24 hours you will need to follow up with your provider, or enter a new e-Visit to address those concerns.  You will get an e-mail in the next   two days asking about your experience.  I hope that your e-visit has been valuable and will speed your recovery. Thank you for using e-visits.  I provided 5 minutes of non face-to-face time during this encounter for chart review, medication and order placement, as well as and documentation.   

## 2021-06-12 ENCOUNTER — Ambulatory Visit: Payer: Medicaid Other | Admitting: Family

## 2021-07-06 ENCOUNTER — Ambulatory Visit (INDEPENDENT_AMBULATORY_CARE_PROVIDER_SITE_OTHER): Payer: Medicaid Other | Admitting: Nurse Practitioner

## 2021-07-06 ENCOUNTER — Other Ambulatory Visit: Payer: Self-pay

## 2021-07-06 ENCOUNTER — Encounter: Payer: Self-pay | Admitting: Nurse Practitioner

## 2021-07-06 DIAGNOSIS — L0100 Impetigo, unspecified: Secondary | ICD-10-CM | POA: Diagnosis not present

## 2021-07-06 MED ORDER — DOXYCYCLINE HYCLATE 100 MG PO TABS: 100.0000 mg | ORAL_TABLET | Freq: Two times a day (BID) | ORAL | 0 refills | Status: AC

## 2021-07-06 MED ORDER — MUPIROCIN 2 % EX OINT: 1.0000 "application " | TOPICAL_OINTMENT | Freq: Two times a day (BID) | CUTANEOUS | 0 refills | Status: AC

## 2021-07-06 NOTE — Progress Notes (Signed)
Virtual Visit via Telephone Note  I connected with Billy Perry on 07/06/21 at  2:40 PM EST by telephone and verified that I am speaking with the correct person using two identifiers.  Location: Patient: home Provider: office   I discussed the limitations, risks, security and privacy concerns of performing an evaluation and management service by telephone and the availability of in person appointments. I also discussed with the patient that there may be a patient responsible charge related to this service. The patient expressed understanding and agreed to proceed.   History of Present Illness:  Patient presents today for sick visit due to telephone visit.  He states that for the past few days he has been having swollen gums and lips with sores.  He states that it does hurt to eat.  He states that his children were recently diagnosed with impetigo.  He states that he did use one of his kids toothbrushes. Denies f/c/s, n/v/d, hemoptysis, PND, chest pain or edema.      Observations/Objective:  Vitals with BMI 11/10/2020 10/17/2020 08/31/2020  Height 6\' 0"  6\' 0"  6\' 0"   Weight 240 lbs 250 lbs 257 lbs  BMI 32.54 33.9 34.85  Systolic - 124 131  Diastolic - 79 85  Pulse - 66 61      Assessment and Plan:  Patient Instructions  Impetigo:   May take zyrtec or benadryl  Will order Bactroban ointment  Will order keflex  Follow up:  Follow up if needed   Impetigo, Adult Impetigo is an infection of the skin. It commonly occurs in young children, but it can also occur in adults. The infection causes itchy blisters and sores that produce brownish-yellow fluid. As the fluid dries, it forms a thick, honey-colored crust. These skin changes usually occur on the face, but they can also affect other areas of the body. Impetigo usually goes away in 7-10 days with treatment. What are the causes? This condition is caused by two types of bacteria. It may be caused by staphylococci or  streptococci bacteria. These bacteria cause impetigo when they get under the surface of the skin. This often happens after some damage to the skin, such as: Cuts, scrapes, or scratches. Rashes. Insect bites, especially when you scratch the area of a bite. Chickenpox or other illnesses that cause open skin sores. Nail biting or chewing. Impetigo can spread easily from one person to another (is contagious). It may be spread through close skin contact or by sharing towels, clothing, or other items that an infected person has touched. Scratching the affected area can cause impetigo to spread to other parts of the body. The bacteria can get under your fingernails and spread when you touch another area of your skin. What increases the risk? The following factors may make you more likely to develop this condition: Playing sports that include skin-to-skin contact with others. Having broken skin, such as from a cut or scrape. Living in an area that has high humidity levels. Having poor hygiene. Having high levels of staphylococci in your nose. Having a condition that weakens the skin integrity, such as: Having a weak body defense system (immune system). Having a skin condition with open sores, such as chickenpox. Having diabetes. What are the signs or symptoms? The main symptom of this condition is small blisters, often on the face around the mouth and nose. In time, the blisters break open and turn into tiny sores (lesions) with a yellow crust. In some cases, the blisters cause itching  or burning. Scratching, irritation, or lack of treatment may cause these small lesions to get larger. Other possible symptoms include: Larger blisters. Pus. Swollen lymph glands. How is this diagnosed? This condition is usually diagnosed during a physical exam. A skin sample or a sample of fluid from a blister may be taken for lab tests that involve growing bacteria (culture test). Lab tests can help confirm the  diagnosis or help determine the best treatment. How is this treated? Treatment for this condition depends on the severity of the condition: Mild impetigo can be treated with prescription antibiotic cream. Oral antibiotic medicine may be used in more severe cases. Medicines that reduce itchiness (antihistamines)may also be used. Follow these instructions at home: Medicines Take over-the-counter and prescription medicines only as told by your health care provider. Apply or take your antibiotic as told by your health care provider. Do not stop using the antibiotic even if your condition improves. Before applying antibiotic cream or ointment, you should: Gently wash the infected areas with antibacterial soap and warm water. Soak crusted areas in warm, soapy water using antibacterial soap. Gently rub the areas to remove crusts. Do not scrub. Preventing the spread of infection  To help prevent impetigo from spreading to other body areas: Keep your fingernails short and clean. Do not scratch the blisters or sores. Cover infected areas, if necessary, to keep from scratching. Wash your hands often with soap and warm water. To help prevent impetigo from spreading to other people: Do not share towels. Wash your clothing and bedsheets in water that is 140F (60C) or warmer. Stay home until you have used an antibiotic cream for 48 hours (2 days) or an oral antibiotic medicine for 24 hours (1 day). You should only return to work and activities with other people if your skin shows significant improvement. You may return to contact sports after you have used antibiotic medicine for 72 hours (3 days). General instructions Keep all follow-up visits. This is important. How is this prevented? Wash your hands often with soap and warm water. Do not share towels, washcloths, clothing, bedding, or razors. Keep your fingernails short. Keep any cuts, scrapes, bug bites, or rashes clean and covered. Use  insect repellent to prevent bug bites. Contact a health care provider if: You develop more blisters or sores, even with treatment. Other family members get sores. Your skin sores are not improving after 72 hours (3 days) of treatment. You have a fever. Get help right away if: You see spreading redness or swelling of the skin around your sores. You develop a sore throat. The area around your rash becomes warm, red, or tender to the touch. You have dark, reddish-brown urine. You do not urinate often or you urinate small amounts. You are very tired (lethargic). You have swelling in your face, hands, or feet. Summary Impetigo is a skin infection that causes itchy blisters and sores that produce brownish-yellow fluid. As the fluid dries, it forms a crust. This condition is caused by staphylococci or streptococci bacteria. These bacteria cause impetigo when they get under the surface of the skin, such as through cuts, rashes, bug bites, or open sores. Treatment for this condition may include antibiotic ointment or oral antibiotics. To help prevent impetigo from spreading to other body areas, make sure you keep your fingernails short, avoid scratching, cover any blisters, and wash your hands often. If you have impetigo, stay home until you have used an antibiotic cream for 48 hours (2 days) or an oral  antibiotic medicine for 24 hours (1 day). You should only return to work and activities with other people if your skin shows significant improvement. This information is not intended to replace advice given to you by your health care provider. Make sure you discuss any questions you have with your health care provider. Document Revised: 10/14/2019 Document Reviewed: 10/14/2019 Elsevier Patient Education  2022 [employer].         I discussed the assessment and treatment plan with the patient. The patient was provided an opportunity to ask questions and all were answered. The patient agreed with  the plan and demonstrated an understanding of the instructions.   The patient was advised to call back or seek an in-person evaluation if the symptoms worsen or if the condition fails to improve as anticipated.  I provided 23 minutes of non-face-to-face time during this encounter.   Ivonne Andrew, NP

## 2021-07-06 NOTE — Patient Instructions (Signed)
Impetigo:   May take zyrtec or benadryl  Will order Bactroban ointment  Will order keflex  Follow up:  Follow up if needed   Impetigo, Adult Impetigo is an infection of the skin. It commonly occurs in young children, but it can also occur in adults. The infection causes itchy blisters and sores that produce brownish-yellow fluid. As the fluid dries, it forms a thick, honey-colored crust. These skin changes usually occur on the face, but they can also affect other areas of the body. Impetigo usually goes away in 7-10 days with treatment. What are the causes? This condition is caused by two types of bacteria. It may be caused by staphylococci or streptococci bacteria. These bacteria cause impetigo when they get under the surface of the skin. This often happens after some damage to the skin, such as: Cuts, scrapes, or scratches. Rashes. Insect bites, especially when you scratch the area of a bite. Chickenpox or other illnesses that cause open skin sores. Nail biting or chewing. Impetigo can spread easily from one person to another (is contagious). It may be spread through close skin contact or by sharing towels, clothing, or other items that an infected person has touched. Scratching the affected area can cause impetigo to spread to other parts of the body. The bacteria can get under your fingernails and spread when you touch another area of your skin. What increases the risk? The following factors may make you more likely to develop this condition: Playing sports that include skin-to-skin contact with others. Having broken skin, such as from a cut or scrape. Living in an area that has high humidity levels. Having poor hygiene. Having high levels of staphylococci in your nose. Having a condition that weakens the skin integrity, such as: Having a weak body defense system (immune system). Having a skin condition with open sores, such as chickenpox. Having diabetes. What are the signs  or symptoms? The main symptom of this condition is small blisters, often on the face around the mouth and nose. In time, the blisters break open and turn into tiny sores (lesions) with a yellow crust. In some cases, the blisters cause itching or burning. Scratching, irritation, or lack of treatment may cause these small lesions to get larger. Other possible symptoms include: Larger blisters. Pus. Swollen lymph glands. How is this diagnosed? This condition is usually diagnosed during a physical exam. A skin sample or a sample of fluid from a blister may be taken for lab tests that involve growing bacteria (culture test). Lab tests can help confirm the diagnosis or help determine the best treatment. How is this treated? Treatment for this condition depends on the severity of the condition: Mild impetigo can be treated with prescription antibiotic cream. Oral antibiotic medicine may be used in more severe cases. Medicines that reduce itchiness (antihistamines)may also be used. Follow these instructions at home: Medicines Take over-the-counter and prescription medicines only as told by your health care provider. Apply or take your antibiotic as told by your health care provider. Do not stop using the antibiotic even if your condition improves. Before applying antibiotic cream or ointment, you should: Gently wash the infected areas with antibacterial soap and warm water. Soak crusted areas in warm, soapy water using antibacterial soap. Gently rub the areas to remove crusts. Do not scrub. Preventing the spread of infection  To help prevent impetigo from spreading to other body areas: Keep your fingernails short and clean. Do not scratch the blisters or sores. Cover infected areas, if  necessary, to keep from scratching. Wash your hands often with soap and warm water. To help prevent impetigo from spreading to other people: Do not share towels. Wash your clothing and bedsheets in water that is  140F (60C) or warmer. Stay home until you have used an antibiotic cream for 48 hours (2 days) or an oral antibiotic medicine for 24 hours (1 day). You should only return to work and activities with other people if your skin shows significant improvement. You may return to contact sports after you have used antibiotic medicine for 72 hours (3 days). General instructions Keep all follow-up visits. This is important. How is this prevented? Wash your hands often with soap and warm water. Do not share towels, washcloths, clothing, bedding, or razors. Keep your fingernails short. Keep any cuts, scrapes, bug bites, or rashes clean and covered. Use insect repellent to prevent bug bites. Contact a health care provider if: You develop more blisters or sores, even with treatment. Other family members get sores. Your skin sores are not improving after 72 hours (3 days) of treatment. You have a fever. Get help right away if: You see spreading redness or swelling of the skin around your sores. You develop a sore throat. The area around your rash becomes warm, red, or tender to the touch. You have dark, reddish-brown urine. You do not urinate often or you urinate small amounts. You are very tired (lethargic). You have swelling in your face, hands, or feet. Summary Impetigo is a skin infection that causes itchy blisters and sores that produce brownish-yellow fluid. As the fluid dries, it forms a crust. This condition is caused by staphylococci or streptococci bacteria. These bacteria cause impetigo when they get under the surface of the skin, such as through cuts, rashes, bug bites, or open sores. Treatment for this condition may include antibiotic ointment or oral antibiotics. To help prevent impetigo from spreading to other body areas, make sure you keep your fingernails short, avoid scratching, cover any blisters, and wash your hands often. If you have impetigo, stay home until you have used an  antibiotic cream for 48 hours (2 days) or an oral antibiotic medicine for 24 hours (1 day). You should only return to work and activities with other people if your skin shows significant improvement. This information is not intended to replace advice given to you by your health care provider. Make sure you discuss any questions you have with your health care provider. Document Revised: 10/14/2019 Document Reviewed: 10/14/2019 Elsevier Patient Education  2022 [employer].

## 2021-07-18 ENCOUNTER — Encounter: Payer: Self-pay | Admitting: Family Medicine

## 2021-07-18 ENCOUNTER — Other Ambulatory Visit: Payer: Self-pay

## 2021-07-18 ENCOUNTER — Ambulatory Visit: Payer: Medicaid Other | Admitting: Family Medicine

## 2021-07-18 VITALS — BP 131/81 | HR 56 | Temp 98.7°F | Resp 16 | Ht 72.0 in | Wt 259.2 lb

## 2021-07-18 DIAGNOSIS — E6609 Other obesity due to excess calories: Secondary | ICD-10-CM | POA: Diagnosis not present

## 2021-07-18 DIAGNOSIS — Z91018 Allergy to other foods: Secondary | ICD-10-CM

## 2021-07-18 DIAGNOSIS — Z6835 Body mass index (BMI) 35.0-35.9, adult: Secondary | ICD-10-CM

## 2021-07-18 NOTE — Progress Notes (Signed)
Patient is here to talk about his HTN within his family. Patient just want to make sure he is taking care of his health before it gets out of hand.

## 2021-07-18 NOTE — Progress Notes (Signed)
Established Patient Office Visit  Subjective:  Patient ID: Billy Perry, male    DOB: 04/29/1991  Age: 31 y.o. MRN: [id number]  CC:  Chief Complaint  Patient presents with   Follow-up   Hypertension    HPI Billy Perry presents for concerns of allergies to onions since having his first covid shot. Would like to be referred to allergist.   Past Medical History:  Diagnosis Date   Asthma    Eczema     History reviewed. No pertinent surgical history.  Family History  Problem Relation Age of Onset   Hypertension Mother     Social History   Socioeconomic History   Marital status: Married    Spouse name: Not on file   Number of children: Not on file   Years of education: Not on file   Highest education level: Not on file  Occupational History   Not on file  Tobacco Use   Smoking status: Never   Smokeless tobacco: Never  Substance and Sexual Activity   Alcohol use: Yes    Comment: socially   Drug use: No   Sexual activity: Not on file  Other Topics Concern   Not on file  Social History Narrative   Not on file   Social Determinants of Health   Financial Resource Strain: Not on file  Food Insecurity: Not on file  Transportation Needs: Not on file  Physical Activity: Not on file  Stress: Not on file  Social Connections: Not on file  Intimate Partner Violence: Not on file    ROS Review of Systems  Allergic/Immunologic: Positive for food allergies. Negative for environmental allergies.  All other systems reviewed and are negative.  Objective:   Today's Vitals: BP 131/81    Pulse (!) 56    Temp 98.7 F (37.1 C) (Oral)    Resp 16    Ht 6' (1.829 m)    Wt 259 lb 3.2 oz (117.6 kg)    SpO2 94%    BMI 35.15 kg/m   Physical Exam Vitals and nursing note reviewed.  Constitutional:      General: He is not in acute distress.    Appearance: He is obese.  Cardiovascular:     Rate and Rhythm: Normal rate and regular rhythm.  Pulmonary:     Effort:  Pulmonary effort is normal.     Breath sounds: Normal breath sounds.  Abdominal:     Palpations: Abdomen is soft.     Tenderness: There is no abdominal tenderness.  Neurological:     General: No focal deficit present.     Mental Status: He is alert and oriented to person, place, and time.    Assessment & Plan:   1. Class 2 obesity due to excess calories without serious comorbidity with body mass index (BMI) of 35.0 to 35.9 in adult Discussed dietary and activity options. Goal is 3-5lbs/mo weight loss. Will monitor  2. 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   [DISCONTINUED] cyclobenzaprine (FLEXERIL) 10 MG tablet Take 1 tablet (10 mg total) by mouth 2 (two) times daily as needed for muscle spasms.   [DISCONTINUED] triamcinolone cream (KENALOG) 0.1 % Apply 1 application topically 2 (two) times daily.   No facility-administered encounter medications on file as of  07/18/2021.    Follow-up: Return in about 6 months (around 01/15/2022) for follow up.   Becky Sax, MD

## 2021-08-09 ENCOUNTER — Encounter: Payer: Self-pay | Admitting: Family Medicine

## 2021-08-13 ENCOUNTER — Telehealth: Payer: Medicaid Other | Admitting: Family

## 2021-08-13 DIAGNOSIS — J069 Acute upper respiratory infection, unspecified: Secondary | ICD-10-CM | POA: Diagnosis not present

## 2021-08-13 MED ORDER — FLUTICASONE PROPIONATE 50 MCG/ACT NA SUSP: 2.0000 | Freq: Every day | NASAL | 6 refills | Status: AC

## 2021-08-13 MED ORDER — BENZONATATE 100 MG PO CAPS: 100.0000 mg | ORAL_CAPSULE | Freq: Three times a day (TID) | ORAL | 0 refills | Status: DC | PRN

## 2021-08-13 NOTE — Progress Notes (Signed)

## 2021-08-29 ENCOUNTER — Telehealth: Payer: Medicaid Other | Admitting: Nurse Practitioner

## 2021-08-29 DIAGNOSIS — R051 Acute cough: Secondary | ICD-10-CM | POA: Diagnosis not present

## 2021-08-29 MED ORDER — ALBUTEROL SULFATE HFA 108 (90 BASE) MCG/ACT IN AERS: 2.0000 | INHALATION_SPRAY | Freq: Four times a day (QID) | RESPIRATORY_TRACT | 0 refills | Status: AC | PRN

## 2021-08-29 NOTE — Progress Notes (Signed)
We are sorry that you are not feeling well.  Here is how we plan to help! ? ?Based on your presentation I believe you most likely have A cough due to a virus.  This is called viral bronchitis and is best treated by rest, plenty of fluids and control of the cough.  You may use Ibuprofen or Tylenol as directed to help your symptoms.   ?  ?In addition you may use A non-prescription cough medication called Mucinex DM: take 2 tablets every 12 hours. ? ?We will call in an inhaler to help with your cough as well. Since the cough is likely from allergies or a virus, continue to use over the counter medications for support as well. If you develop a fever or the cough persists without improvement for longer than a week you should be reevaluated for antibiotics.  ? ?Meds ordered this encounter  ?Medications  ? albuterol (VENTOLIN HFA) 108 (90 Base) MCG/ACT inhaler  ?  Sig: Inhale 2 puffs into the lungs every 6 (six) hours as needed for wheezing or shortness of breath.  ?  Dispense:  8 g  ?  Refill:  0  ?  ? ?From your responses in the eVisit questionnaire you describe inflammation in the upper respiratory tract which is causing a significant cough.  This is commonly called Bronchitis and has four common causes:   ?Allergies ?Viral Infections ?Acid Reflux ?Bacterial Infection ?Allergies, viruses and acid reflux are treated by controlling symptoms or eliminating the cause. An example might be a cough caused by taking certain blood pressure medications. You stop the cough by changing the medication. Another example might be a cough caused by acid reflux. Controlling the reflux helps control the cough. ? ?USE OF BRONCHODILATOR ("RESCUE") INHALERS: ?There is a risk from using your bronchodilator too frequently.  The risk is that over-reliance on a medication which only relaxes the muscles surrounding the breathing tubes can reduce the effectiveness of medications prescribed to reduce swelling and congestion of the tubes themselves.   Although you feel brief relief from the bronchodilator inhaler, your asthma may actually be worsening with the tubes becoming more swollen and filled with mucus.  This can delay other crucial treatments, such as oral steroid medications. If you need to use a bronchodilator inhaler daily, several times per day, you should discuss this with your provider.  There are probably better treatments that could be used to keep your asthma under control.  ?   ?HOME CARE ?Only take medications as instructed by your medical team. ?Complete the entire course of an antibiotic. ?Drink plenty of fluids and get plenty of rest. ?Avoid close contacts especially the very young and the elderly ?Cover your mouth if you cough or cough into your sleeve. ?Always remember to wash your hands ?A steam or ultrasonic humidifier can help congestion.  ? ?GET HELP RIGHT AWAY IF: ?You develop worsening fever. ?You become short of breath ?You cough up blood. ?Your symptoms persist after you have completed your treatment plan ?MAKE SURE YOU  ?Understand these instructions. ?Will watch your condition. ?Will get help right away if you are not doing well or get worse. ?  ? ?Thank you for choosing an e-visit. ? ?Your e-visit answers were reviewed by a board certified advanced clinical practitioner to complete your personal care plan. Depending upon the condition, your plan could have included both over the counter or prescription medications. ? ?Please review your pharmacy choice. Make sure the pharmacy is open so you  can pick up prescription now. If there is a problem, you may contact your provider through [employer] and have the prescription routed to another pharmacy.  Your safety is important to [place]. If you have drug allergies check your prescription carefully.  ? ?For the next 24 hours you can use MyChart to ask questions about today's visit, request a non-urgent call back, or ask for a work or school excuse. ?You will get an email in the next  two days asking about your experience. I hope that your e-visit has been valuable and will speed your recovery.  ? ?I spent approximately 7 minutes reviewing the patient's history, current symptoms and coordinating their plan of care today.   ? ?Meds ordered this encounter  ?Medications  ? albuterol (VENTOLIN HFA) 108 (90 Base) MCG/ACT inhaler  ?  Sig: Inhale 2 puffs into the lungs every 6 (six) hours as needed for wheezing or shortness of breath.  ?  Dispense:  8 g  ?  Refill:  0  ?  ?

## 2021-09-18 NOTE — Progress Notes (Signed)
? ?NEW PATIENT ?Date of Service/Encounter:  09/20/21 ?Referring provider: Dorna Mai, MD ?Primary care provider: Dorna Mai, MD ? ?Subjective:  ?Billy Perry is a 31 y.o. male with a PMHx of obesity, snoring presenting today for evaluation of chronic rhinitis, concern for food allergy ?History obtained from: chart review and patient and spouse . ?  ?Concern for food allergy- ?Over a year ago, he did have pfizer vaccine (first injection), around 2 months later started to develop sensitivity to onions ?He was cutting the grass that had wild onion growing and noticed a rash on his body and was seen in urgent care. This was about one-two months after getting his injection. ?He does not have a history of seasonal allergies.   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 ?After that, anytime he cooks onions-smelling it or cooking it, causes his throat to be itchy or have a hard time breathing.  His wife notes that if he cooks onions until they are translucent, he is able to eat them without significant symptoms. ? ?No formal diagnosis of asthma, but he has used an inhaler when he had pneumonia in early 2018 and had not used again until he was recently given another rescue inhaler during increased pollen counts this Spring.  This is the first year he has needed it during the pollen season.   ?He will feel like it is difficult to breath.  He uses nasal spray which helps some with the nasal congestion, but not the chest tightness.  Inhaler helps when he uses it. ? ?PCP visit on 07/18/2021-patient reporting allergy to onions since having his first COVID shot ? ?Additionally has a history of allergy to amoxicillin, developed hives.  Incidence occurred in childhood.  ? ?He has had allergy testing in the past as a young child.  Does not remember any results. ? ?He does have eczema but mostly on his face.  He has had a cream for it in the past, but doesn't use it.  ? ?Other allergy  screening: ?Hymenoptera allergy: no ?Urticaria: no ?History of recurrent infections suggestive of immunodeficency: no ? ? ?Past Medical History: ?Past Medical History:  ?Diagnosis Date  ? Asthma   ? Eczema   ? ?Medication List:  ?Current Outpatient Medications  ?Medication Sig Dispense Refill  ? fluticasone (FLONASE) 50 MCG/ACT nasal spray Place 2 sprays into both nostrils daily. 16 g 6  ? hydrocortisone 2.5 % ointment Apply topically twice daily as need to red sandpapery rash. 30 g 0  ? albuterol (VENTOLIN HFA) 108 (90 Base) MCG/ACT inhaler Inhale 2 puffs into the lungs every 6 (six) hours as needed for wheezing or shortness of breath. (Patient not taking: Reported on 09/20/2021) 8 g 0  ? ?No current facility-administered medications for this visit.  ? ?Known Allergies:  ?Allergies  ?Allergen Reactions  ? Amoxicillin Hives  ?  Childhood   ? ?Past Surgical History: ?No past surgical history on file. ?Family History: ?Family History  ?Problem Relation Age of Onset  ? Hypertension Mother   ? ?Social History: Abdelkarim lives in an apartment built 20 years ago, no water damage, carpet floors, gas heating, central AC, pet dog, not using dust mite protection, no smoke exposure, no HEPA filter in the home.  ? ?ROS:  ?All other systems negative except as noted per HPI. ? ?Objective:  ?Blood pressure 122/82, pulse 62, temperature 99.2 ?F (37.3 ?C), resp. rate 16, height 6' (1.829 m), weight 262 lb  6 oz (119 kg), SpO2 96 %. ?Body mass index is 35.58 kg/m?Marland Kitchen ?Physical Exam: ? ?General Appearance:  Alert, cooperative, no distress, appears stated age  ?Head:  Normocephalic, without obvious abnormality, atraumatic  ?Eyes:  Conjunctiva clear, EOM's intact  ?Nose: Nares normal, hypertrophic turbinates, normal mucosa, no visible anterior polyps, and septum midline  ?Throat: Lips, tongue normal; teeth and gums normal, normal posterior oropharynx  ?Neck: Supple, symmetrical  ?Lungs:   clear to auscultation bilaterally, Respirations  unlabored, no coughing  ?Heart:  regular rate and rhythm and no murmur, Appears well perfused  ?Extremities: No edema  ?Skin: Skin color, texture, turgor normal, dry patchy area between eyebrows, bilateral cheeks near nasal folds  ?Neurologic: No gross deficits  ? ? ? ?Diagnostics: ?Spirometry:  ?Tracings reviewed. His effort: Good reproducible efforts. ?FVC: 3.92L  ?FEV1: 3.33L, 80% predicted  ?FEV1/FVC ratio: 102%  ?Interpretation:  Nonobstructive pattern   ? ?Skin Testing: Environmental allergy panel and select foods. ? Adequate controls. ?Results discussed with patient/family. ? Airborne Adult Perc - 09/20/21 1021   ? ? Time Antigen Placed 1021   ? Allergen Manufacturer Waynette Buttery   ? Location Back   ? Number of Test 59   ? Panel 1 Select   ? 1. Control-Buffer 50% Glycerol Negative   ? 2. Control-Histamine 1 mg/ml 3+   ? 3. Albumin saline Negative   ? 4. Bahia Negative   ? 5. [place] 3+   ? 6. Johnson Negative   ? 7. Kentucky Blue Negative   ? 8. Meadow Fescue 4+   ? 9. Perennial Rye Negative   ? 10. Sweet Vernal 4+   ? 11. Timothy 3+   ? 12. Cocklebur 3+   ? 13. Burweed Marshelder Negative   ? 14. Ragweed, short Negative   ? 15. Ragweed, Giant 3+   ? 16. Plantain,  English 3+   ? 17. Lamb's Quarters 3+   ? 18. Sheep Sorrell 3+   ? 19. Rough Pigweed 3+   ? 20. Marsh Elder, Rough 3+   ? 21. Mugwort, Common 3+   ? 22. Ash mix 2+   ? 23. Charletta Cousin mix Negative   ? 24. Beech American Negative   ? 25. Box, Elder 3+   ? 26. Cedar, red Negative   ? 27. Cottonwood, Guinea-Bissau Negative   ? 28. Elm mix 3+   ? 29. Hickory Negative   ? 30. Maple mix Negative   ? 31. Oak, Guinea[place] mix Negative   ? 32. Pecan Pollen Negative   ? 33. Pine mix Negative   ? 34. Sycamore Eastern Negative   ? 35. Walnut, Black Pollen Negative   ? 36. Alternaria alternata 4+   ? 37. Cladosporium Herbarum Negative   ? 38. Aspergillus mix 2+   ? 39. Penicillium mix 2+   ? 40. Bipolaris sorokiniana (Helminthosporium) Negative   ? 41. Drechslera spicifera  (Curvularia) Negative   ? 42. Mucor plumbeus Negative   ? 43. Fusarium moniliforme Negative   ? 44. Aureobasidium pullulans (pullulara) 3+   ? 45. Rhizopus oryzae Negative   ? 46. Botrytis cinera Negative   ? 47. Epicoccum nigrum Negative   ? 48. Phoma betae Negative   ? 49. Candida Albicans 3+   ? 50. Trichophyton mentagrophytes Negative   ? 51. Mite, D Farinae  5,000 AU/ml Negative   ? 52. Mite, D Pteronyssinus  5,000 AU/ml Negative   ? 53. Cat Hair 10,000 BAU/ml Negative   ? 54.  Dog Epithelia Negative   ? 55. Mixed Feathers Negative   ? 56. Horse Epithelia Negative   ? 57. Cockroach, German 4+   ? 58. Mouse Negative   ? 59. Tobacco Leaf Negative   ? ?  ?  ? ?  ? ? Intradermal - 09/20/21 1117   ? ? Time Antigen Placed [id number]   ? Allergen Manufacturer Lavella Hammock   ? Location Arm   ? Number of Test 4   ? Control Negative   ? Cat Negative   ? Dog Negative   ? Mite mix 4+   ? ?  ?  ? ?  ? ? Food Adult Perc - 09/20/21 1000   ? ? Time Antigen Placed 1021   ? Allergen Manufacturer Lavella Hammock   ? Location Back   ? Number of allergen test 2   ? Control-Histamine 1 mg/ml 3+   ? 49. Onion --   4 x 12  ? ?  ?  ? ?  ? ? ?Allergy testing results were read and interpreted by myself, documented by clinical staff. ? 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 ?Chronic rhinitis -seasonal and perennial allergic: ?- allergy testing today was positive to grass pollen, weed pollen, tree pollen, indoor and outdoor molds, cockroach, dust mite ?- allergen avoidance as below ?-  consider allergy shots as long term control of your symptoms by teaching your immune system to be more tolerant of your allergy triggers ?- Continue Nasal Steroid Spray: Options include Flonase (fluticasone), Nasocort (triamc

## 2021-09-20 ENCOUNTER — Encounter: Payer: Self-pay | Admitting: Internal Medicine

## 2021-09-20 ENCOUNTER — Ambulatory Visit (INDEPENDENT_AMBULATORY_CARE_PROVIDER_SITE_OTHER): Payer: Medicaid Other | Admitting: Internal Medicine

## 2021-09-20 VITALS — BP 122/82 | HR 62 | Temp 99.2°F | Resp 16 | Ht 72.0 in | Wt 262.4 lb

## 2021-09-20 DIAGNOSIS — T781XXA Other adverse food reactions, not elsewhere classified, initial encounter: Secondary | ICD-10-CM

## 2021-09-20 DIAGNOSIS — R21 Rash and other nonspecific skin eruption: Secondary | ICD-10-CM

## 2021-09-20 DIAGNOSIS — Z880 Allergy status to penicillin: Secondary | ICD-10-CM

## 2021-09-20 DIAGNOSIS — T7819XA Other adverse food reactions, not elsewhere classified, initial encounter: Secondary | ICD-10-CM | POA: Insufficient documentation

## 2021-09-20 DIAGNOSIS — J310 Chronic rhinitis: Secondary | ICD-10-CM | POA: Diagnosis not present

## 2021-09-20 DIAGNOSIS — J4520 Mild intermittent asthma, uncomplicated: Secondary | ICD-10-CM | POA: Insufficient documentation

## 2021-09-20 DIAGNOSIS — J302 Other seasonal allergic rhinitis: Secondary | ICD-10-CM | POA: Insufficient documentation

## 2021-09-20 MED ORDER — CETIRIZINE HCL 10 MG PO TABS: 10.0000 mg | ORAL_TABLET | Freq: Every day | ORAL | 5 refills | Status: DC

## 2021-09-20 MED ORDER — EPINEPHRINE 0.3 MG/0.3ML IJ SOAJ: 0.3000 mg | INTRAMUSCULAR | 2 refills | Status: DC | PRN

## 2021-09-20 MED ORDER — HYDROCORTISONE 2.5 % EX OINT: TOPICAL_OINTMENT | CUTANEOUS | 0 refills | Status: AC

## 2021-09-20 NOTE — Patient Instructions (Addendum)
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 ?Chronic rhinitis -seasonal and perennial allergic: ?- allergy testing today was positive to grass pollen, weed pollen, tree pollen, indoor and outdoor molds, cockroach, dust mite ?- allergen avoidance as below ?- consider allergy shots as long term control of your symptoms by teaching your immune system to be more tolerant of your allergy triggers ?- Continue Nasal Steroid Spray: Options include Flonase (fluticasone), Nasocort (triamcinolone), Nasonex (mometasome) 1- 2 sprays in each nostril daily (can buy over-the-counter if not covered by insurance)  Best results if used daily. ?- Continue over the counter antihistamine daily or daily as needed.  Can take twice daily on bad allergy days. ?-Your options include Zyrtec (Cetirizine) 10mg , Claritin (Loratadine) 10mg , Allegra (Fexofenadine) 180mg , or Xyzal (Levocetirinze) 5mg  ? ?H/O Penicillin allergy: ?- please schedule follow-up appt at your convenience for penicillin testing followed by graded oral challenge if indicated ?- please refrain from taking any antihistamines at least 3 days prior to this appointment  ?- around 80% of individuals outgrow this allergy in ~ 10 years and carrying it as a diagnosis can prevent you from getting proper therapy if needed ? ?Intermittent asthma: ?- your lung testing today looked great, keep an eye out on how often you are needing your rescue inhaler as this will help guide our therapy ?- Rescue Inhaler: Albuterol (Proair/Ventolin) 2 puffs .  Use  every 4-6 hours as needed for chest tightness, wheezing, or coughing.  Can also use 15 minutes prior to exercise if you have symptoms with activity. ?- Asthma is not controlled if: ? - Symptoms are occurring >2 times a week OR ? - >2 times a month nighttime awakenings ? - You are requiring systemic steroids (prednisone/steroid injections) more than once per year ? - Your require hospitalization for your asthma. ? - Please call the clinic to schedule a follow up if these symptoms arise ? ?Atopic Dermatitis vs seborrheic derm:  ?Daily Care For Maintenance (daily and continue even once eczema controlled) ?- Use hypoallergenic hydrating ointment at least twice daily.  This must be done daily for control of flares. (Great options include Vaseline, CeraVe, Aquaphor, Aveeno, Cetaphil, VaniCream, etc) ?- Avoid detergents, soaps or lotions with fragrances/dyes ?- Limit showers/baths to 5 minutes and use luke warm water instead of hot, pat dry following baths, and apply moisturizer ?- can use steroid/non-steroid therapy creams as detailed below up to twice weekly for prevention of flares. ? ?For Flares:(add this to maintenance therapy if needed for flares) ?First apply steroid/non-steroid treatment creams. Wait 5 minutes then apply moisturizer.  ?- Hydrocortisone 2.5% to face-apply topically twice daily to red, raised areas of skin, followed by moisturizer ? ?Follow-up in 3 months, sooner if needed.  If you decide to pursue allergy injections, please call to schedule that appointment ?It was a pleasure meeting you in clinic today. ? ?Reducing Pollen Exposure ? ?The American Academy of Allergy, Asthma and Immunology suggests the following steps to reduce your exposure to pollen during allergy seasons. ?   ?Do not hang sheets or clothing out to  dry; pollen may collect on these items. ?Do not mow lawns or spend time around freshly cut grass; mowing stirs up pollen. ?Keep windows closed at night.  Keep car windows closed  while driving. ?Minimize morning activities outdoors, a time when pollen counts are usually at their highest. ?Stay indoors as much as possible when pollen counts or humidity is high and on windy days when pollen tends to remain in the air longer. ?Use air conditioning when possible.  Many air conditioners have filters that trap the pollen spores. ?Use a HEPA room air filter to remove pollen form the indoor air you breathe. ? ?Control of Mold Allergen  ? ?Mold and fungi can grow on a variety of surfaces provided certain temperature and moisture conditions exist.  Outdoor molds grow on plants, decaying vegetation and soil.  The major outdoor mold, Alternaria and Cladosporium, are found in very high numbers during hot and dry conditions.  Generally, a late Summer - Fall peak is seen for common outdoor fungal spores.  Rain will temporarily lower outdoor mold spore count, but counts rise rapidly when the rainy period ends.  The most important indoor molds are Aspergillus and Penicillium.  Dark, humid and poorly ventilated basements are ideal sites for mold growth.  The next most common sites of mold growth are the bathroom and the kitchen. ? ?Outdoor (Seasonal) Mold Control ? ?Use air conditioning and keep windows closed ?Avoid exposure to decaying vegetation. ?Avoid leaf raking. ?Avoid grain handling. ?Consider wearing a face mask if working in moldy areas.  ? ? ?Indoor (Perennial) Mold Control  ? ?Maintain humidity below 50%. ?Clean washable surfaces with 5% bleach solution. ?Remove sources e.g. contaminated carpets. ? ? ? ?DUST MITE AVOIDANCE MEASURES: ? ?There are three main measures that need and can be taken to avoid house dust mites: ? ?Reduce accumulation of dust in general ?-reduce furniture, clothing, carpeting, books, stuffed animals, especially in bedroom ? ?Separate yourself from the dust ?-use pillow and mattress encasements (can be found at stores such as [employer] or online) ?-avoid direct  exposure to air condition flow ?-use a HEPA filter device, especially in the bedroom; you can also use a HEPA filter vacuum cleaner ?-wipe dust with a moist towel instead of a dry towel or broom when cleaning ? ?Decrease mites and/or their secretions ?-wash clothing and linen and stuffed animals at highest temperature possible, at least every 2 weeks ?-stuffed animals can also be placed in a bag and put in a freezer overnight ? ?Despite the above measures, it is impossible to eliminate dust mites or their allergen completely from your home.  With the above measures the burden of mites in your home can be diminished, with the goal of minimizing your allergic symptoms.  Success will be reached only when implementing and using all means together. ? ?Control of Cockroach Allergen ? ?Cockroach allergen has been identified as an important cause of acute attacks of asthma, especially in urban settings.  There are fifty-five species of cockroach that exist in the [place], however only three, the [place], [place] species produce allergen that can affect patients with Asthma.  Allergens can be obtained from fecal particles, egg casings and secretions from cockroaches. ?   ?Remove food sources. ?Reduce access to water. ?Seal access and entry points. ?Spray runways with 0.5-1% Diazinon or Chlorpyrifos ?Blow boric acid power under stoves and refrigerator. ?Place bait stations (hydramethylnon) at feeding sites. ? ? ? ? ?

## 2021-11-15 ENCOUNTER — Ambulatory Visit
Admission: EM | Admit: 2021-11-15 | Discharge: 2021-11-15 | Disposition: A | Discharge status: Home / Self Care | Payer: Medicaid Other | Attending: Internal Medicine | Admitting: Internal Medicine

## 2021-11-15 ENCOUNTER — Other Ambulatory Visit: Payer: Self-pay

## 2021-11-15 ENCOUNTER — Encounter: Payer: Self-pay | Admitting: Emergency Medicine

## 2021-11-15 DIAGNOSIS — H5789 Other specified disorders of eye and adnexa: Secondary | ICD-10-CM

## 2021-11-15 DIAGNOSIS — H1011 Acute atopic conjunctivitis, right eye: Secondary | ICD-10-CM | POA: Diagnosis not present

## 2021-11-15 MED ORDER — OLOPATADINE HCL 0.1 % OP SOLN: 1.0000 [drp] | Freq: Two times a day (BID) | OPHTHALMIC | 12 refills | Status: AC

## 2021-11-15 NOTE — Discharge Instructions (Signed)
It appears that your symptoms are allergic in nature.  You have been prescribed an eyedrop to help alleviate symptoms.  Please follow-up with provided contact information for eye doctor if symptoms persist or worsen.

## 2021-11-15 NOTE — ED Provider Notes (Signed)
[name]    CSN: [id number] Arrival date & time: 11/15/21  0805      History   Chief Complaint Chief Complaint  Patient presents with   Eye Problem    HPI Billy Perry is a 31 y.o. male.   Patient presents with right eye lower lid swelling and irritation that occurred yesterday.  Patient denies any trauma or foreign body to the eye.  Reports blurry vision that occurred yesterday but denies any blurry vision today.  Denies any drainage from the eye.  Patient reports this occurred in the past and was told that pollen caused it.  He does take daily allergy medicine but is not sure the name of it.  Patient does not wear contacts.  Denies any associated upper respiratory symptoms.   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 No pertinent surgical history.     Home Medications    Prior to Admission medications   Medication Sig Start Date End Date Taking? Authorizing Provider  olopatadine (PATANOL) 0.1 % ophthalmic solution Place 1 drop into the right eye 2 (two) times daily. 11/15/21  Yes Larz Mark, Acie Fredrickson, FNP  albuterol (VENTOLIN HFA) 108 (90 Base) MCG/ACT inhaler Inhale 2 puffs into the lungs every 6 (six) hours as needed for wheezing or shortness of breath. Patient not taking: Reported on 09/20/2021 08/29/21   Viviano Simas, FNP  cetirizine (ZYRTEC ALLERGY) 10 MG tablet Take 1 tablet (10 mg total) by mouth daily. 09/20/21   Verlee Monte, MD  EPINEPHrine (EPIPEN 2-PAK) 0.3 mg/0.3 mL IJ SOAJ injection Inject 0.3 mg into the muscle as needed for up to 1 dose for anaphylaxis. 09/20/21   Verlee Monte, MD  fluticasone (FLONASE) 50 MCG/ACT nasal spray Place 2  sprays into both nostrils daily. 08/13/21   Jannifer Rodney A, FNP  hydrocortisone 2.5 % ointment Apply topically twice daily as need to red sandpapery rash. 09/20/21   Verlee Monte, MD    Family History Family History  Problem Relation Age of Onset   Hypertension Mother     Social History Social History   Tobacco Use   Smoking status: Never   Smokeless tobacco: Never  Vaping Use   Vaping Use: Never used  Substance Use Topics   Alcohol use: Yes    Comment: socially   Drug use: No     Allergies   Amoxicillin   Review of Systems Review of Systems Per HPI  Physical Exam Triage Vital Signs ED Triage Vitals  Enc Vitals Group     BP 11/15/21 0815 (!) 146/89     Pulse Rate 11/15/21 0815 (!) 57     Resp 11/15/21 0815 20     Temp 11/15/21 0815 98.1 F (36.7 C)     Temp Source 11/15/21 0815 Oral     SpO2 11/15/21 0815 96 %     Weight --      Height --      Head Circumference --      Peak Flow --      Pain Score 11/15/21 0813 5     Pain Loc --  Pain Edu? --      Excl. in GC? --    No data found.  Updated Vital Signs BP (!) 146/89 (BP Location: Left Arm) Comment (BP Location): large cuff  Pulse (!) 57   Temp 98.1 F (36.7 C) (Oral)   Resp 20   SpO2 96%   Visual Acuity Right Eye Distance:   Left Eye Distance:   Bilateral Distance:    Right Eye Near:   Left Eye Near:    Bilateral Near:     Physical Exam Constitutional:      General: He is not in acute distress.    Appearance: Normal appearance. He is not toxic-appearing or diaphoretic.  HENT:     Head: Normocephalic and atraumatic.  Eyes:     General: Lids are everted, no foreign bodies appreciated. Vision grossly intact. Gaze aligned appropriately.     Extraocular Movements: Extraocular movements intact.     Conjunctiva/sclera:     Right eye: Right conjunctiva is injected. No chemosis, exudate or hemorrhage.    Pupils: Pupils are equal, round, and reactive to light.     Comments: Very mild  swelling to right lower lid.  There are no obvious abnormalities located to the eye.  Mild erythema to right conjunctiva. No drainage noted.   Pulmonary:     Effort: Pulmonary effort is normal.  Neurological:     General: No focal deficit present.     Mental Status: He is alert and oriented to person, place, and time. Mental status is at baseline.  Psychiatric:        Mood and Affect: Mood normal.        Behavior: Behavior normal.        Thought Content: Thought content normal.        Judgment: Judgment normal.      UC Treatments / Results  Labs (all labs ordered are listed, but only abnormal results are displayed) Labs Reviewed - No data to display  EKG   Radiology No results found.  Procedures Procedures (including critical care time)  Medications Ordered in UC Medications - No data to display  Initial Impression / Assessment and Plan / UC Course  I have reviewed the triage vital signs and the nursing notes.  Pertinent labs & imaging results that were available during my care of the patient were reviewed by me and considered in my medical decision making (see chart for details).     There are no obvious abnormalities to eye but right conjunctivae is mildly erythematous and mildly swollen right lower lid.  This appears to indicate possible allergic conjunctivitis or allergy related symptoms.  Will treat with Patanol eyedrops.  Patient to continue daily antihistamine.  No concern for bacterial infection or foreign body.  Patient to follow-up with provided contact information for eye doctor if symptoms persist or worsen.  Patient verbalized understanding and was agreeable with plan. Final Clinical Impressions(s) / UC Diagnoses   Final diagnoses:  Eye irritation  Allergic conjunctivitis of right eye     Discharge Instructions      It appears that your symptoms are allergic in nature.  You have been prescribed an eyedrop to help alleviate symptoms.  Please follow-up  with provided contact information for eye doctor if symptoms persist or worsen.    ED Prescriptions     Medication Sig Dispense Auth. Provider   olopatadine (PATANOL) 0.1 % ophthalmic solution Place 1 drop into the right eye 2 (two) times daily. 5 mL [place],  Acie Fredrickson, FNP      PDMP not reviewed this encounter.   Gustavus Bryant, [place] 11/15/21 [phone]

## 2021-11-15 NOTE — ED Triage Notes (Signed)
Complains of right eye swelling and soreness yesterday.  Reports swelling is better, but feels like something in eye.  Patient does not wear contacts.

## 2021-12-18 NOTE — Progress Notes (Deleted)
FOLLOW UP Date of Service/Encounter:  12/18/21   Subjective:  Billy Perry (DOB: 12-Aug-1990) is a 31 y.o. male who [facility] in re-evaluation of the following: Allergic rhinitis, asthma, pollen food allergy syndrome, and penicillin allergy, rash History obtained from: chart review and {Persons; PED relatives w/patient:19415::"patient"}.  For Review, LV was on 09/20/21  with Dr.Thomasenia Dowse seen for intial visit for Allergic rhinitis, asthma, pollen food allergy syndrome, and penicillin allergy . We started Flonase and Zyrtec, discussed allergy injections.  Offered to challenge her penicillin allergy. We also provided hydrocortisone for rash on his face which we are unclear whether was atopic dermatitis versus seborrheic dermatitis   Pertinent History/Diagnostics:  - Asthma: intermittent, pneumonia in early 2018 and first given albuterol during this period, flares seasonally (Spring)  -  spirometry (09/20/21): ratio 102%, 80% FEV1, nonobstructive pattern - Allergic Rhinitis:   - SPT environmental panel (09/20/21): grass pollen, weed pollen, tree pollen, indoor and outdoor molds, cockroach, dust mite - Pollen Food Allergy (onion)  - Hx of reaction: smelling it or cooking it, causes his throat to be itchy or have a hard time breathing.  His wife notes that if he cooks onions until they are translucent, he is able to eat them without significant symptoms  - SPT select foods (09/20/21): positive - H/o penicillin allergy-developed hives.  Incidence occurred in childhood. challenge offered  Today presents for follow-up. ***  Allergies as of 12/20/2021       Reactions   Amoxicillin Hives   Childhood         Medication List        Accurate as of December 18, 2021  1:07 PM. If you have any questions, ask your nurse or doctor.          albuterol 108 (90 Base) MCG/ACT inhaler Commonly known as: VENTOLIN HFA Inhale 2 puffs into the lungs  every 6 (six) hours as needed for wheezing or shortness of breath.   cetirizine 10 MG tablet Commonly known as: ZyrTEC Allergy Take 1 tablet (10 mg total) by mouth daily.   EPINEPHrine 0.3 mg/0.3 mL Soaj injection Commonly known as: EpiPen 2-Pak Inject 0.3 mg into the muscle as needed for up to 1 dose for anaphylaxis.   fluticasone 50 MCG/ACT nasal spray Commonly known as: FLONASE Place 2 sprays into both nostrils daily.   hydrocortisone 2.5 % ointment Apply topically twice daily as need to red sandpapery rash.   olopatadine 0.1 % ophthalmic solution Commonly known as: Patanol Place 1 drop into the right eye 2 (two) times daily.       Past Medical History:  Diagnosis Date   Asthma    Eczema    No past surgical history on file. Otherwise, there have been no changes to his past medical history, surgical history, family history, or social history.  ROS: All others negative except as noted per HPI.   Objective:  There were no vitals taken for this visit. There is no height or weight on file to calculate BMI. Physical Exam: General Appearance:  Alert, cooperative, no distress, appears stated age  Head:  Normocephalic, without obvious abnormality, atraumatic  Eyes:  Conjunctiva clear, EOM's intact  Nose: Nares normal, {Blank multiple:19196:a:"***","hypertrophic turbinates","normal mucosa","no visible anterior polyps","septum midline"}  Throat: Lips, tongue normal; teeth and gums normal, {Blank multiple:19196:a:"***","normal posterior oropharynx","tonsils 2+","tonsils 3+","no tonsillar exudate","+ cobblestoning"}  Neck: Supple, symmetrical  Lungs:   {Blank multiple:19196:a:"***","clear to auscultation bilaterally","end-expiratory wheezing","wheezing throughout"}, Respirations unlabored, {  Blank multiple:19196:a:"***","no coughing","intermittent dry coughing"}  Heart:  {Blank multiple:19196:a:"***","regular rate and rhythm","no murmur"}, Appears well perfused  Extremities: No  edema  Skin: Skin color, texture, turgor normal, no rashes or lesions on visualized portions of skin  Neurologic: No gross deficits   Reviewed: ***  Spirometry:  Tracings reviewed. His effort: {Blank single:19197::"Good reproducible efforts.","It was hard to get consistent efforts and there is a question as to whether this reflects a maximal maneuver.","Poor effort, data can not be interpreted.","Variable effort-results affected.","decent for first attempt at spirometry."} FVC: ***L FEV1: ***L, ***% predicted FEV1/FVC ratio: ***% Interpretation: {Blank single:19197::"Spirometry consistent with mild obstructive disease","Spirometry consistent with moderate obstructive disease","Spirometry consistent with severe obstructive disease","Spirometry consistent with possible restrictive disease","Spirometry consistent with mixed obstructive and restrictive disease","Spirometry uninterpretable due to technique","Spirometry consistent with normal pattern","No overt abnormalities noted given today's efforts"}.  Please see scanned spirometry results for details.  Skin Testing: {Blank single:19197::"Select foods","Environmental allergy panel","Environmental allergy panel and select foods","Food allergy panel","None","Deferred due to recent antihistamines use","deferred due to recent reaction"}. ***Adequate positive and negative controls Results discussed with patient/family.   {Blank single:19197::"Allergy testing results were read and interpreted by myself, documented by clinical staff."," "}  Assessment/Plan   ***  Tonny Bollman, MD  [facility]

## 2021-12-20 ENCOUNTER — Ambulatory Visit: Payer: Medicaid Other | Admitting: Internal Medicine

## 2022-01-08 ENCOUNTER — Encounter: Payer: Self-pay | Admitting: Family Medicine

## 2022-01-08 ENCOUNTER — Ambulatory Visit: Payer: Medicaid Other | Admitting: Family Medicine

## 2022-01-08 VITALS — BP 135/88 | HR 70 | Temp 97.7°F | Resp 16 | Ht 72.0 in | Wt 257.0 lb

## 2022-01-08 DIAGNOSIS — I10 Essential (primary) hypertension: Secondary | ICD-10-CM | POA: Diagnosis not present

## 2022-01-08 DIAGNOSIS — F341 Dysthymic disorder: Secondary | ICD-10-CM | POA: Diagnosis not present

## 2022-01-08 MED ORDER — LISINOPRIL 10 MG PO TABS: 10.0000 mg | ORAL_TABLET | Freq: Every day | ORAL | 0 refills | Status: DC

## 2022-01-08 NOTE — Progress Notes (Unsigned)
Patient is here for follow-up hypertension. Patient is a little concern about his hypertension, and low Testerone.

## 2022-01-08 NOTE — Progress Notes (Unsigned)
Established Patient Office Visit  Subjective    Patient ID: Billy Perry, male    DOB: 09-Oct-1990  Age: 31 y.o. MRN: [id number]  CC:  Chief Complaint  Patient presents with   Follow-up    HPI Billy Perry presents for follow up of hypertension. Patient denies acute complaints.    Outpatient Encounter Medications as of 01/08/2022  Medication Sig   cetirizine (ZYRTEC ALLERGY) 10 MG tablet Take 1 tablet (10 mg total) by mouth daily.   EPINEPHrine (EPIPEN 2-PAK) 0.3 mg/0.3 mL IJ SOAJ injection Inject 0.3 mg into the muscle as needed for up to 1 dose for anaphylaxis.   fluticasone (FLONASE) 50 MCG/ACT nasal spray Place 2 sprays into both nostrils daily.   hydrocortisone 2.5 % ointment Apply topically twice daily as need to red sandpapery rash.   lisinopril (ZESTRIL) 10 MG tablet Take 1 tablet (10 mg total) by mouth daily.   olopatadine (PATANOL) 0.1 % ophthalmic solution Place 1 drop into the right eye 2 (two) times daily.   albuterol (VENTOLIN HFA) 108 (90 Base) MCG/ACT inhaler Inhale 2 puffs into the lungs every 6 (six) hours as needed for wheezing or shortness of breath. (Patient not taking: Reported on 01/08/2022)   No facility-administered encounter medications on file as of 01/08/2022.    Past Medical History:  Diagnosis Date   Asthma    Eczema     No past surgical history on file.  Family History  Problem Relation Age of Onset   Hypertension Mother     Social History   Socioeconomic History   Marital status: Married    Spouse name: Not on file   Number of children: Not on file   Years of education: Not on file   Highest education level: Not on file  Occupational History   Not on file  Tobacco Use   Smoking status: Never   Smokeless tobacco: Never  Vaping Use   Vaping Use: Never used  Substance and Sexual Activity   Alcohol use: Yes    Comment: socially   Drug use: No   Sexual activity: Not on file  Other Topics Concern   Not on file   Social History Narrative   Not on file   Social Determinants of Health   Financial Resource Strain: Not on file  Food Insecurity: Not on file  Transportation Needs: Not on file  Physical Activity: Not on file  Stress: Not on file  Social Connections: Not on file  Intimate Partner Violence: Not on file    Review of Systems  All other systems reviewed and are negative.       Objective    BP 135/88   Pulse 70   Temp 97.7 F (36.5 C) (Oral)   Resp 16   Ht 6' (1.829 m)   Wt 257 lb (116.6 kg)   SpO2 93%   BMI 34.86 kg/m   Physical Exam Vitals and nursing note reviewed.  Constitutional:      General: He is not in acute distress. Cardiovascular:     Rate and Rhythm: Normal rate and regular rhythm.  Pulmonary:     Effort: Pulmonary effort is normal.     Breath sounds: Normal breath sounds.  Abdominal:     Palpations: Abdomen is soft.     Tenderness: There is no abdominal tenderness.  Neurological:     General: No focal deficit present.     Mental Status: He is alert and oriented to person, place, and  time.     {Labs (Optional):23779}    Assessment & Plan:   1. Essential hypertension Continues with elevated readings. Will prescribe lisinopril 10 mg daily and monitor  2. Dysthymia Patient defers meds/counseling or other management a at this time. Monitor     Return in about 6 weeks (around 02/19/2022) for follow up hypertension and dysthymia.   Tommie Raymond, MD

## 2022-01-10 ENCOUNTER — Encounter: Payer: Self-pay | Admitting: Family Medicine

## 2022-02-19 ENCOUNTER — Ambulatory Visit: Payer: Medicaid Other | Admitting: Family Medicine

## 2022-02-19 ENCOUNTER — Encounter: Payer: Self-pay | Admitting: Family Medicine

## 2022-02-19 VITALS — BP 129/80 | HR 62 | Ht 73.0 in | Wt 255.0 lb

## 2022-02-19 DIAGNOSIS — I10 Essential (primary) hypertension: Secondary | ICD-10-CM

## 2022-02-19 MED ORDER — LISINOPRIL 10 MG PO TABS: 10.0000 mg | ORAL_TABLET | Freq: Every day | ORAL | 1 refills | Status: DC

## 2022-02-22 NOTE — Progress Notes (Signed)
Established Patient Office Visit  Subjective    Patient ID: Billy Perry, male    DOB: Oct 07, 1990  Age: 31 y.o. MRN: [id number]  CC:  Chief Complaint  Patient presents with   Follow-up    6 week- FU-Hypertension and Dysthymia     HPI Billy Perry presents for routine follow up of hypertension. Denies acute complaints or concerns.    Outpatient Encounter Medications as of 02/19/2022  Medication Sig   albuterol (VENTOLIN HFA) 108 (90 Base) MCG/ACT inhaler Inhale 2 puffs into the lungs every 6 (six) hours as needed for wheezing or shortness of breath.   cetirizine (ZYRTEC ALLERGY) 10 MG tablet Take 1 tablet (10 mg total) by mouth daily.   EPINEPHrine (EPIPEN 2-PAK) 0.3 mg/0.3 mL IJ SOAJ injection Inject 0.3 mg into the muscle as needed for up to 1 dose for anaphylaxis.   fluticasone (FLONASE) 50 MCG/ACT nasal spray Place 2 sprays into both nostrils daily.   hydrocortisone 2.5 % ointment Apply topically twice daily as need to red sandpapery rash.   olopatadine (PATANOL) 0.1 % ophthalmic solution Place 1 drop into the right eye 2 (two) times daily.   [DISCONTINUED] lisinopril (ZESTRIL) 10 MG tablet Take 1 tablet (10 mg total) by mouth daily.   lisinopril (ZESTRIL) 10 MG tablet Take 1 tablet (10 mg total) by mouth daily.   No facility-administered encounter medications on file as of 02/19/2022.    Past Medical History:  Diagnosis Date   Asthma    Eczema     History reviewed. No pertinent surgical history.  Family History  Problem Relation Age of Onset   Hypertension Mother     Social History   Socioeconomic History   Marital status: Married    Spouse name: Not on file   Number of children: Not on file   Years of education: Not on file   Highest education level: Not on file  Occupational History   Not on file  Tobacco Use   Smoking status: Never   Smokeless tobacco: Never  Vaping Use   Vaping Use: Never used  Substance and Sexual Activity   Alcohol  use: Yes    Comment: socially   Drug use: No   Sexual activity: Not on file  Other Topics Concern   Not on file  Social History Narrative   Not on file   Social Determinants of Health   Financial Resource Strain: Not on file  Food Insecurity: Not on file  Transportation Needs: Not on file  Physical Activity: Not on file  Stress: Not on file  Social Connections: Not on file  Intimate Partner Violence: Not on file    Review of Systems  All other systems reviewed and are negative.       Objective    BP 129/80 (BP Location: Left Arm, Patient Position: Sitting)   Pulse 62   Ht 6\' 1"  (1.854 m)   Wt 255 lb (115.7 kg)   SpO2 95%   BMI 33.64 kg/m   Physical Exam Vitals and nursing note reviewed.  Constitutional:      General: He is not in acute distress. Cardiovascular:     Rate and Rhythm: Normal rate and regular rhythm.  Pulmonary:     Effort: Pulmonary effort is normal.     Breath sounds: Normal breath sounds.  Abdominal:     Palpations: Abdomen is soft.     Tenderness: There is no abdominal tenderness.  Neurological:     General: No  focal deficit present.     Mental Status: He is alert and oriented to person, place, and time.         Assessment & Plan:   1. Essential hypertension Appears stable. Continue present management. Meds refilled.     Return in about 6 months (around 08/20/2022) for physical.   Becky Sax, MD

## 2022-05-31 ENCOUNTER — Telehealth: Payer: Medicaid Other | Admitting: Physician Assistant

## 2022-05-31 ENCOUNTER — Ambulatory Visit
Admission: EM | Admit: 2022-05-31 | Discharge: 2022-05-31 | Disposition: A | Discharge status: Home / Self Care | Payer: Medicaid Other | Attending: Internal Medicine | Admitting: Internal Medicine

## 2022-05-31 ENCOUNTER — Ambulatory Visit: Payer: Self-pay

## 2022-05-31 ENCOUNTER — Ambulatory Visit (INDEPENDENT_AMBULATORY_CARE_PROVIDER_SITE_OTHER): Payer: Medicaid Other

## 2022-05-31 DIAGNOSIS — R110 Nausea: Secondary | ICD-10-CM

## 2022-05-31 DIAGNOSIS — R1030 Lower abdominal pain, unspecified: Secondary | ICD-10-CM | POA: Diagnosis not present

## 2022-05-31 DIAGNOSIS — R102 Pelvic and perineal pain: Secondary | ICD-10-CM

## 2022-05-31 DIAGNOSIS — R339 Retention of urine, unspecified: Secondary | ICD-10-CM

## 2022-05-31 DIAGNOSIS — R3911 Hesitancy of micturition: Secondary | ICD-10-CM

## 2022-05-31 LAB — POCT URINALYSIS DIP (MANUAL ENTRY)
Bilirubin, UA: NEGATIVE
Blood, UA: NEGATIVE
Glucose, UA: NEGATIVE mg/dL
Ketones, POC UA: NEGATIVE mg/dL
Leukocytes, UA: NEGATIVE
Nitrite, UA: NEGATIVE
Protein Ur, POC: NEGATIVE mg/dL
Spec Grav, UA: 1.02 (ref 1.010–1.025)
Urobilinogen, UA: 0.2 E.U./dL
pH, UA: 7 (ref 5.0–8.0)

## 2022-05-31 MED ORDER — POLYETHYLENE GLYCOL 3350 17 G PO PACK: 17.0000 g | PACK | Freq: Every day | ORAL | 0 refills | Status: AC

## 2022-05-31 MED ORDER — ONDANSETRON 4 MG PO TBDP: 4.0000 mg | ORAL_TABLET | Freq: Three times a day (TID) | ORAL | 0 refills | Status: AC | PRN

## 2022-05-31 NOTE — Progress Notes (Signed)
Virtual Visit Consent   Billy Perry, you are scheduled for a virtual visit with a [facility] provider today. Just as with appointments in the office, your consent must be obtained to participate. Your consent will be active for this visit and any virtual visit you may have with one of our providers in the next 365 days. If you have a MyChart account, a copy of this consent can be sent to you electronically.  As this is a virtual visit, video technology does not allow for your provider to perform a traditional examination. This may limit your provider's ability to fully assess your condition. If your provider identifies any concerns that need to be evaluated in person or the need to arrange testing (such as labs, EKG, etc.), we will make arrangements to do so. Although advances in technology are sophisticated, we cannot ensure that it will always work on either your end or our end. If the connection with a video visit is poor, the visit may have to be switched to a telephone visit. With either a video or telephone visit, we are not always able to ensure that we have a secure connection.  By engaging in this virtual visit, you consent to the provision of healthcare and authorize for your insurance to be billed (if applicable) for the services provided during this visit. Depending on your insurance coverage, you may receive a charge related to this service.  I need to obtain your verbal consent now. Are you willing to proceed with your visit today? LADARRELL CORNWALL has provided verbal consent on 05/31/2022 for a virtual visit (video or telephone). Mar Daring, PA-C  Date: 05/31/2022 12:49 PM  Virtual Visit via Video Note   I, Mar Daring, connected with  Billy Perry  ([id number], 03-07-1991) on 05/31/22 at 12:45 PM EST by a video-enabled telemedicine application and verified that I am speaking with the correct person using two identifiers.  Location: Patient: Virtual  Visit Location Patient: Home Provider: Virtual Visit Location Provider: Home Office   I discussed the limitations of evaluation and management by telemedicine and the availability of in person appointments. The patient expressed understanding and agreed to proceed.    History of Present Illness: Billy Perry is a 32 y.o. who identifies as a male who was assigned male at birth, and is being seen today for abdominal pain.  HPI: Abdominal Pain This is a new problem. The onset quality is gradual. The problem occurs constantly. The problem has been gradually worsening. The pain is located in the suprapubic region. The pain is moderate. The quality of the pain is aching and a sensation of fullness. Associated symptoms include frequency. Pertinent negatives include no anorexia, diarrhea, dysuria, fever, hematochezia, hematuria, melena, nausea or vomiting. Associated symptoms comments: Urine hesitancy and retention ("feels like I am not completely empty after urinating"). Nothing aggravates the pain. The pain is relieved by Nothing. He has tried nothing for the symptoms. The treatment provided no relief.     Problems:  Patient Active Problem List   Diagnosis Date Noted   Mild intermittent asthma 09/20/2021   Seasonal and perennial allergic rhinitis 09/20/2021   Pollen-food allergy 09/20/2021   History of penicillin allergy 09/20/2021   Lumbar pain 09/19/2020    Allergies:  Allergies  Allergen Reactions   Onion Shortness Of Breath and Swelling   Amoxicillin Hives    Childhood    Medications:  Current Outpatient Medications:    albuterol (VENTOLIN HFA) 108 (90  Base) MCG/ACT inhaler, Inhale 2 puffs into the lungs every 6 (six) hours as needed for wheezing or shortness of breath., Disp: 8 g, Rfl: 0   cetirizine (ZYRTEC ALLERGY) 10 MG tablet, Take 1 tablet (10 mg total) by mouth daily., Disp: 30 tablet, Rfl: 5   EPINEPHrine (EPIPEN 2-PAK) 0.3 mg/0.3 mL IJ SOAJ injection, Inject 0.3 mg into  the muscle as needed for up to 1 dose for anaphylaxis., Disp: 1 each, Rfl: 2   fluticasone (FLONASE) 50 MCG/ACT nasal spray, Place 2 sprays into both nostrils daily., Disp: 16 g, Rfl: 6   hydrocortisone 2.5 % ointment, Apply topically twice daily as need to red sandpapery rash., Disp: 30 g, Rfl: 0   lisinopril (ZESTRIL) 10 MG tablet, Take 1 tablet (10 mg total) by mouth daily., Disp: 90 tablet, Rfl: 1   olopatadine (PATANOL) 0.1 % ophthalmic solution, Place 1 drop into the right eye 2 (two) times daily., Disp: 5 mL, Rfl: 12  Observations/Objective: Patient is well-developed, well-nourished in no acute distress.  Resting comfortably at home.  Head is normocephalic, atraumatic.  No labored breathing.  Speech is clear and coherent with logical content.  Patient is alert and oriented at baseline.    Assessment and Plan: 1. Urinary hesitancy  2. Urine retention  3. Suprapubic pressure  - Advised for in person evaluation for testing and full evaluation as male UTI without history  we worry about prostate or kidney conditions.  The standard of care is to examine the abdomen and kidneys, and to do a urine and blood test to make sure that something more serious is not going on.  We recommend that you see a provider today.  If your doctor's office is closed please seek care at an in person Urgent Care.   Follow Up Instructions: I discussed the assessment and treatment plan with the patient. The patient was provided an opportunity to ask questions and all were answered. The patient agreed with the plan and demonstrated an understanding of the instructions.  A copy of instructions were sent to the patient via MyChart unless otherwise noted below.    The patient was advised to call back or seek an in-person evaluation if the symptoms worsen or if the condition fails to improve as anticipated.  Time:  I spent 8 minutes with the patient via telehealth technology discussing the above  problems/concerns.    Mar Daring, PA-C

## 2022-05-31 NOTE — ED Triage Notes (Signed)
Pt presents with lower abdominal pain, some slight nausea and dizziness for past few years.

## 2022-05-31 NOTE — Telephone Encounter (Signed)
Will send to PCP as a [place]

## 2022-05-31 NOTE — ED Provider Notes (Signed)
[name]    CSN: [id number] Arrival date & time: 05/31/22  1457      History   Chief Complaint Chief Complaint  Patient presents with   Abdominal Pain    Stomach pain - Entered by patient   Urinary Retention    HPI Billy Perry is a 32 y.o. male.   Patient presents with lower abdominal pain and nausea without vomiting that started about 3 weeks ago.  Patient reports abdominal pain is rated 6/10 on pain scale and is described as a cramping pain.  Denies diarrhea.  Last bowel movement was yesterday.  Patient reports that he has not been having daily bowel movements as normal since symptoms started.  States he typically has daily bowel movements but this has not been occurring since symptoms started.  Reports that he had flulike symptoms prior to the symptoms starting.  Denies any known sick contacts.  Denies any dysuria, urinary frequency, penile discharge, back pain, fever. States that food aggravates symptoms. Denies blood in stool.    Abdominal Pain   Past Medical History:  Diagnosis Date   Asthma    Eczema     Patient Active Problem List   Diagnosis Date Noted   Mild intermittent asthma 09/20/2021   Seasonal and perennial allergic rhinitis 09/20/2021   Pollen-food allergy 09/20/2021   History of penicillin allergy 09/20/2021   Lumbar pain 09/19/2020    History reviewed. No pertinent surgical history.     Home Medications    Prior to Admission medications   Medication Sig Start Date End Date Taking? Authorizing Provider  ondansetron (ZOFRAN-ODT) 4 MG disintegrating tablet Take 1 tablet (4 mg total) by mouth every 8 (eight) hours as needed for nausea or vomiting. 05/31/22  Yes Rosabella Edgin, Rolly Salter E, FNP  polyethylene glycol (MIRALAX) 17 g packet Take 17 g by mouth daily. 05/31/22  Yes Delise Simenson, Acie Fredrickson, FNP  albuterol (VENTOLIN HFA) 108 (90 Base) MCG/ACT inhaler Inhale 2 puffs into the lungs every 6 (six) hours as needed for wheezing or shortness of  breath. 08/29/21   Viviano Simas, FNP  cetirizine (ZYRTEC ALLERGY) 10 MG tablet Take 1 tablet (10 mg total) by mouth daily. 09/20/21   Verlee Monte, MD  EPINEPHrine (EPIPEN 2-PAK) 0.3 mg/0.3 mL IJ SOAJ injection Inject 0.3 mg into the muscle as needed for up to 1 dose for anaphylaxis. 09/20/21   Verlee Monte, MD  fluticasone (FLONASE) 50 MCG/ACT nasal spray Place 2 sprays into both nostrils daily. 08/13/21   Jannifer Rodney A, FNP  hydrocortisone 2.5 % ointment Apply topically twice daily as need to red sandpapery rash. 09/20/21   Verlee Monte, MD  lisinopril (ZESTRIL) 10 MG tablet Take 1 tablet (10 mg total) by mouth daily. 02/19/22   [name], MD  olopatadine (PATANOL) 0.1 % ophthalmic solution Place 1 drop into the right eye 2 (two) times daily. 11/15/21   Gustavus Bryant, FNP    Family History Family History  Problem Relation Age of Onset   Hypertension Mother     Social History Social History   Tobacco Use   Smoking status: Never   Smokeless tobacco: Never  Vaping Use   Vaping Use: Never used  Substance Use Topics   Alcohol use: Yes    Comment: socially   Drug use: No     Allergies   Onion and Amoxicillin   Review of Systems Review of Systems Per HPI  Physical Exam Triage Vital Signs ED Triage Vitals [  05/31/22 1633]  Enc Vitals Group     BP 124/80     Pulse Rate 60     Resp 18     Temp 97.7 F (36.5 C)     Temp Source Oral     SpO2 96 %     Weight      Height      Head Circumference      Peak Flow      Pain Score 6     Pain Loc      Pain Edu?      Excl. in [place]?    No data found.  Updated Vital Signs BP 124/80 (BP Location: Right Arm)   Pulse 60   Temp 97.7 F (36.5 C) (Oral)   Resp 18   SpO2 96%   Visual Acuity Right Eye Distance:   Left Eye Distance:   Bilateral Distance:    Right Eye Near:   Left Eye Near:    Bilateral Near:     Physical Exam Constitutional:      General: He is not in acute distress.    Appearance: Normal  appearance. He is not toxic-appearing or diaphoretic.  HENT:     Head: Normocephalic and atraumatic.  Eyes:     Extraocular Movements: Extraocular movements intact.     Conjunctiva/sclera: Conjunctivae normal.  Cardiovascular:     Rate and Rhythm: Normal rate and regular rhythm.     Pulses: Normal pulses.     Heart sounds: Normal heart sounds.  Pulmonary:     Effort: Pulmonary effort is normal. No respiratory distress.     Breath sounds: Normal breath sounds.  Abdominal:     General: Bowel sounds are normal. There is no distension.     Palpations: Abdomen is soft.     Tenderness: There is abdominal tenderness.     Comments: Mild tenderness to palpation across lower abdomen.  Neurological:     General: No focal deficit present.     Mental Status: He is alert and oriented to person, place, and time. Mental status is at baseline.  Psychiatric:        Mood and Affect: Mood normal.        Behavior: Behavior normal.        Thought Content: Thought content normal.        Judgment: Judgment normal.      UC Treatments / Results  Labs (all labs ordered are listed, but only abnormal results are displayed) Labs Reviewed  CBC  COMPREHENSIVE METABOLIC PANEL  POCT URINALYSIS DIP (MANUAL ENTRY)    EKG   Radiology DG Abd 2 Views  Result Date: 05/31/2022 CLINICAL DATA:  Lower abdominal pain.  Nausea and dizziness. EXAM: ABDOMEN - 2 VIEW COMPARISON:  None Available. FINDINGS: Unremarkable bowel gas pattern. No significant abnormal air-levels or dilated bowel. Spiral calcific density projects over the right hemipelvis, probably a vascular calcification. No other significant calcifications identified. IMPRESSION: 1. Unremarkable bowel gas pattern. 2. Spiral calcific density projects over the right hemipelvis, probably a vascular calcification. Electronically Signed   By: Van Clines M.D.   On: 05/31/2022 17:10    Procedures Procedures (including critical care time)  Medications  Ordered in UC Medications - No data to display  Initial Impression / Assessment and Plan / UC Course  I have reviewed the triage vital signs and the nursing notes.  Pertinent labs & imaging results that were available during my care of the patient were reviewed by me and  considered in my medical decision making (see chart for details).     Differential diagnoses include viral illness versus constipation.  UA was normal.  I am concerned for constipation so will treat with MiraLAX.  Ondansetron prescribed to take as needed for nausea.  Abdominal x-ray performed with no obvious abdominal abnormality.  It did show incidental finding of calcific density and this was discussed with patient. Encouraged patient to follow up with pcp for this. Will obtain CMP and CBC.  Do not think that any more advanced imaging is necessary at this time as there are no signs of acute abdomen on exam.  Although, patient was advised to go straight to the ER if symptoms persist or worsen.  Encouraged patient to follow-up with PCP for further evaluation and management as he may need a GI referral.  Patient is concerned given that his biological father was diagnosed with colon cancer in his 77s.  Reiterated the importance of PCP follow-up for possible GI referral due to this.  Although, low concern that this is related to this.  Patient verbalized understanding and was agreeable with plan. Final Clinical Impressions(s) / UC Diagnoses   Final diagnoses:  Lower abdominal pain  Nausea without vomiting     Discharge Instructions      I have prescribed you MiraLAX to take daily for concern for constipation.  I also prescribed you nausea medication to take as needed.  Please ensure a bland diet.  See attached instructions.  Follow-up with your primary care doctor for further evaluation and management.  Go straight to the ER if symptoms persist or worsen.  Blood work is pending     ED Prescriptions     Medication Sig  Dispense Auth. Provider   polyethylene glycol (MIRALAX) 17 g packet Take 17 g by mouth daily. 14 each Gwendlyon Zumbro, Hildred Alamin E, FNP   ondansetron (ZOFRAN-ODT) 4 MG disintegrating tablet Take 1 tablet (4 mg total) by mouth every 8 (eight) hours as needed for nausea or vomiting. 20 tablet Luther, Michele Rockers, [place]      PDMP not reviewed this encounter.   Teodora Medici, [place] 05/31/22 1729

## 2022-05-31 NOTE — Discharge Instructions (Signed)
I have prescribed you MiraLAX to take daily for concern for constipation.  I also prescribed you nausea medication to take as needed.  Please ensure a bland diet.  See attached instructions.  Follow-up with your primary care doctor for further evaluation and management.  Go straight to the ER if symptoms persist or worsen.  Blood work is pending

## 2022-05-31 NOTE — Telephone Encounter (Signed)
  Chief Complaint: Abdominal pain - not completely emptying bladder Symptoms: above Frequency: a few days Pertinent Negatives: Patient denies  Disposition: [] ED /[x] Urgent Care (no appt availability in office) / [] Appointment(In office/virtual)/ []  [facility]/ [] Home Care/ [] Refused Recommended Disposition /[] [place] [facility]/ []  Follow-up with PCP Additional Notes: Spoke with pt and wife.PT had a telehealth appt this morning and was told to see PCP today or go to UC. PT will go to UC    Reason for Disposition  [1] Follow-up call to recent contact AND [2] information only call, no triage required  Answer Assessment - Initial Assessment Questions 1. REASON FOR CALL or QUESTION: "What is your reason for calling today?" or "How can I best help you?" or "What question do you have that I can help answer?"     Pt needed appt today.  Protocols used: Information Only Call - No Triage-A-AH

## 2022-05-31 NOTE — Patient Instructions (Signed)
  Billy Perry, thank you for joining Mar Daring, PA-C for today's virtual visit.  While this provider is not your primary care provider (PCP), if your PCP is located in our provider database this encounter information will be shared with them immediately following your visit.   [facility] account gives you access to today's visit and all your visits, tests, and labs performed at [facility] " click here if you don't have a [facility] account or go to [email or web address]  Consent: (Patient) Billy Perry provided verbal consent for this virtual visit at the beginning of the encounter.  Current Medications:  Current Outpatient Medications:    albuterol (VENTOLIN HFA) 108 (90 Base) MCG/ACT inhaler, Inhale 2 puffs into the lungs every 6 (six) hours as needed for wheezing or shortness of breath., Disp: 8 g, Rfl: 0   cetirizine (ZYRTEC ALLERGY) 10 MG tablet, Take 1 tablet (10 mg total) by mouth daily., Disp: 30 tablet, Rfl: 5   EPINEPHrine (EPIPEN 2-PAK) 0.3 mg/0.3 mL IJ SOAJ injection, Inject 0.3 mg into the muscle as needed for up to 1 dose for anaphylaxis., Disp: 1 each, Rfl: 2   fluticasone (FLONASE) 50 MCG/ACT nasal spray, Place 2 sprays into both nostrils daily., Disp: 16 g, Rfl: 6   hydrocortisone 2.5 % ointment, Apply topically twice daily as need to red sandpapery rash., Disp: 30 g, Rfl: 0   lisinopril (ZESTRIL) 10 MG tablet, Take 1 tablet (10 mg total) by mouth daily., Disp: 90 tablet, Rfl: 1   olopatadine (PATANOL) 0.1 % ophthalmic solution, Place 1 drop into the right eye 2 (two) times daily., Disp: 5 mL, Rfl: 12   Medications ordered in this encounter:  No orders of the defined types were placed in this encounter.    *If you need refills on other medications prior to your next appointment, please contact your pharmacy*  Follow-Up: Call back or seek an in-person evaluation if the symptoms worsen or if the condition fails to  improve as anticipated.  [place] [phone]  Other Instructions - Advised for in person evaluation for testing and full evaluation as male UTI without history  we worry about prostate or kidney conditions.  The standard of care is to examine the abdomen and kidneys, and to do a urine and blood test to make sure that something more serious is not going on.  We recommend that you see a provider today.  If your doctor's office is closed please seek care at an in person Urgent Care.   If you have been instructed to have an in-person evaluation today at a local Urgent Care facility, please use the link below. It will take you to a list of all of our available [facility], including address, phone number and hours of operation. Please do not delay care.  [facility]  If you or a family member do not have a primary care provider, use the link below to schedule a visit and establish care. When you choose a [facility] primary care physician or advanced practice provider, you gain a long-term partner in health. Find a Primary Care Provider  Learn more about [facility]'s in-office and virtual care options: [facility]

## 2022-06-02 LAB — COMPREHENSIVE METABOLIC PANEL
ALT: 21 IU/L (ref 0–44)
AST: 21 IU/L (ref 0–40)
Albumin/Globulin Ratio: 1.6 (ref 1.2–2.2)
Albumin: 4.6 g/dL (ref 4.1–5.1)
Alkaline Phosphatase: 78 IU/L (ref 44–121)
BUN/Creatinine Ratio: 8 — ABNORMAL LOW (ref 9–20)
BUN: 8 mg/dL (ref 6–20)
Bilirubin Total: 0.6 mg/dL (ref 0.0–1.2)
CO2: 22 mmol/L (ref 20–29)
Calcium: 9.8 mg/dL (ref 8.7–10.2)
Chloride: 101 mmol/L (ref 96–106)
Creatinine, Ser: 0.95 mg/dL (ref 0.76–1.27)
Globulin, Total: 2.9 g/dL (ref 1.5–4.5)
Glucose: 84 mg/dL (ref 70–99)
Potassium: 4 mmol/L (ref 3.5–5.2)
Sodium: 139 mmol/L (ref 134–144)
Total Protein: 7.5 g/dL (ref 6.0–8.5)
eGFR: 110 mL/min/{1.73_m2} (ref >59)

## 2022-06-02 LAB — CBC
Hematocrit: 47.7 % (ref 37.5–51.0)
Hemoglobin: 15.4 g/dL (ref 13.0–17.7)
MCH: 28.9 pg (ref 26.6–33.0)
MCHC: 32.3 g/dL (ref 31.5–35.7)
MCV: 90 fL (ref 79–97)
Platelets: 261 10*3/uL (ref 150–450)
RBC: 5.33 x10E6/uL (ref 4.14–5.80)
RDW: 12.3 % (ref 11.6–15.4)
WBC: 4.8 10*3/uL (ref 3.4–10.8)

## 2022-08-20 ENCOUNTER — Ambulatory Visit (INDEPENDENT_AMBULATORY_CARE_PROVIDER_SITE_OTHER): Payer: Medicaid Other | Admitting: Family Medicine

## 2022-08-20 VITALS — BP 121/78 | HR 64 | Temp 98.1°F | Resp 16 | Ht 72.0 in | Wt 251.0 lb

## 2022-08-20 DIAGNOSIS — Z130 Encounter for screening for diseases of the blood and blood-forming organs and certain disorders involving the immune mechanism: Secondary | ICD-10-CM | POA: Diagnosis not present

## 2022-08-20 DIAGNOSIS — Z13220 Encounter for screening for lipoid disorders: Secondary | ICD-10-CM

## 2022-08-20 DIAGNOSIS — Z0000 Encounter for general adult medical examination without abnormal findings: Secondary | ICD-10-CM | POA: Diagnosis not present

## 2022-08-21 ENCOUNTER — Encounter: Payer: Self-pay | Admitting: Family Medicine

## 2022-08-21 LAB — CBC WITH DIFFERENTIAL/PLATELET
Basophils Absolute: 0.1 10*3/uL (ref 0.0–0.2)
Basos: 1 %
EOS (ABSOLUTE): 0.2 10*3/uL (ref 0.0–0.4)
Eos: 3 %
Hematocrit: 48.6 % (ref 37.5–51.0)
Hemoglobin: 16.2 g/dL (ref 13.0–17.7)
Immature Grans (Abs): 0 10*3/uL (ref 0.0–0.1)
Immature Granulocytes: 0 %
Lymphocytes Absolute: 1.4 10*3/uL (ref 0.7–3.1)
Lymphs: 22 %
MCH: 28.9 pg (ref 26.6–33.0)
MCHC: 33.3 g/dL (ref 31.5–35.7)
MCV: 87 fL (ref 79–97)
Monocytes Absolute: 0.5 10*3/uL (ref 0.1–0.9)
Monocytes: 7 %
Neutrophils Absolute: 4.3 10*3/uL (ref 1.4–7.0)
Neutrophils: 67 %
Platelets: 213 10*3/uL (ref 150–450)
RBC: 5.61 x10E6/uL (ref 4.14–5.80)
RDW: 12.1 % (ref 11.6–15.4)
WBC: 6.4 10*3/uL (ref 3.4–10.8)

## 2022-08-21 LAB — CMP14+EGFR
ALT: 39 IU/L (ref 0–44)
AST: 27 IU/L (ref 0–40)
Albumin/Globulin Ratio: 1.2 (ref 1.2–2.2)
Albumin: 4 g/dL — ABNORMAL LOW (ref 4.1–5.1)
Alkaline Phosphatase: 93 IU/L (ref 44–121)
BUN/Creatinine Ratio: 9 (ref 9–20)
BUN: 8 mg/dL (ref 6–20)
Bilirubin Total: 0.7 mg/dL (ref 0.0–1.2)
CO2: 21 mmol/L (ref 20–29)
Calcium: 9.6 mg/dL (ref 8.7–10.2)
Chloride: 100 mmol/L (ref 96–106)
Creatinine, Ser: 0.93 mg/dL (ref 0.76–1.27)
Globulin, Total: 3.4 g/dL (ref 1.5–4.5)
Glucose: 79 mg/dL (ref 70–99)
Potassium: 4 mmol/L (ref 3.5–5.2)
Sodium: 139 mmol/L (ref 134–144)
Total Protein: 7.4 g/dL (ref 6.0–8.5)
eGFR: 112 mL/min/{1.73_m2} (ref >59)

## 2022-08-21 LAB — LIPID PANEL
Chol/HDL Ratio: 4.3 ratio (ref 0.0–5.0)
Cholesterol, Total: 133 mg/dL (ref 100–199)
HDL: 31 mg/dL — ABNORMAL LOW (ref >39)
LDL Chol Calc (NIH): 82 mg/dL (ref 0–99)
Triglycerides: 110 mg/dL (ref 0–149)
VLDL Cholesterol Cal: 20 mg/dL (ref 5–40)

## 2022-08-21 NOTE — Progress Notes (Signed)
Established Patient Office Visit  Subjective    Patient ID: Billy Perry, male    DOB: Oct 22, 1990  Age: 32 y.o. MRN: [id number]  CC:  Chief Complaint  Patient presents with   Annual Exam    HPI Billy Perry presents for routine annual exam. Patient denies acute complaints or concerns.    Outpatient Encounter Medications as of 08/20/2022  Medication Sig   albuterol (VENTOLIN HFA) 108 (90 Base) MCG/ACT inhaler Inhale 2 puffs into the lungs every 6 (six) hours as needed for wheezing or shortness of breath.   cetirizine (ZYRTEC ALLERGY) 10 MG tablet Take 1 tablet (10 mg total) by mouth daily.   EPINEPHrine (EPIPEN 2-PAK) 0.3 mg/0.3 mL IJ SOAJ injection Inject 0.3 mg into the muscle as needed for up to 1 dose for anaphylaxis.   fluticasone (FLONASE) 50 MCG/ACT nasal spray Place 2 sprays into both nostrils daily.   hydrocortisone 2.5 % ointment Apply topically twice daily as need to red sandpapery rash.   lisinopril (ZESTRIL) 10 MG tablet Take 1 tablet (10 mg total) by mouth daily.   olopatadine (PATANOL) 0.1 % ophthalmic solution Place 1 drop into the right eye 2 (two) times daily.   ondansetron (ZOFRAN-ODT) 4 MG disintegrating tablet Take 1 tablet (4 mg total) by mouth every 8 (eight) hours as needed for nausea or vomiting.   polyethylene glycol (MIRALAX) 17 g packet Take 17 g by mouth daily.   No facility-administered encounter medications on file as of 08/20/2022.    Past Medical History:  Diagnosis Date   Asthma    Eczema     History reviewed. No pertinent surgical history.  Family History  Problem Relation Age of Onset   Hypertension Mother     Social History   Socioeconomic History   Marital status: Married    Spouse name: Not on file   Number of children: Not on file   Years of education: Not on file   Highest education level: Not on file  Occupational History   Not on file  Tobacco Use   Smoking status: Never   Smokeless tobacco: Never  Vaping  Use   Vaping Use: Never used  Substance and Sexual Activity   Alcohol use: Yes    Comment: socially   Drug use: No   Sexual activity: Not on file  Other Topics Concern   Not on file  Social History Narrative   Not on file   Social Determinants of Health   Financial Resource Strain: Not on file  Food Insecurity: Not on file  Transportation Needs: Not on file  Physical Activity: Not on file  Stress: Not on file  Social Connections: Not on file  Intimate Partner Violence: Not on file    Review of Systems  All other systems reviewed and are negative.       Objective    BP 121/78   Pulse 64   Temp 98.1 F (36.7 C) (Oral)   Resp 16   Ht 6' (1.829 m)   Wt 251 lb (113.9 kg)   SpO2 95%   BMI 34.04 kg/m   Physical Exam Vitals and nursing note reviewed.  Constitutional:      General: He is not in acute distress. HENT:     Head: Normocephalic and atraumatic.     Right Ear: Tympanic membrane, ear canal and external ear normal.     Left Ear: Tympanic membrane, ear canal and external ear normal.     Nose: Nose  normal.     Mouth/Throat:     Mouth: Mucous membranes are moist.     Pharynx: Oropharynx is clear.  Eyes:     Conjunctiva/sclera: Conjunctivae normal.     Pupils: Pupils are equal, round, and reactive to light.  Neck:     Thyroid: No thyromegaly.  Cardiovascular:     Rate and Rhythm: Normal rate and regular rhythm.     Heart sounds: Normal heart sounds. No murmur heard. Pulmonary:     Effort: Pulmonary effort is normal.     Breath sounds: Normal breath sounds.  Abdominal:     General: There is no distension.     Palpations: Abdomen is soft. There is no mass.     Tenderness: There is no abdominal tenderness.     Hernia: There is no hernia in the left inguinal area or right inguinal area.  Genitourinary:    Penis: Normal and circumcised.      Testes: Normal.  Musculoskeletal:        General: Normal range of motion.     Cervical back: Normal range of  motion and neck supple.     Right lower leg: No edema.     Left lower leg: No edema.  Skin:    General: Skin is warm and dry.  Neurological:     General: No focal deficit present.     Mental Status: He is alert and oriented to person, place, and time. Mental status is at baseline.  Psychiatric:        Mood and Affect: Mood normal.        Behavior: Behavior normal.         Assessment & Plan:   1. Annual physical exam  - CMP14+EGFR  2. Screening for deficiency anemia  - CBC with Differential  3. Screening for lipid disorders  - Lipid Panel    Return in about 6 months (around 02/20/2023) for follow up.   Becky Sax, MD

## 2022-10-18 ENCOUNTER — Encounter: Payer: Self-pay | Admitting: Family Medicine

## 2022-10-18 ENCOUNTER — Other Ambulatory Visit: Payer: Self-pay | Admitting: Internal Medicine

## 2022-10-18 ENCOUNTER — Other Ambulatory Visit: Payer: Self-pay | Admitting: Family Medicine

## 2022-10-18 MED ORDER — CETIRIZINE HCL 10 MG PO TABS: 10.0000 mg | ORAL_TABLET | Freq: Every day | ORAL | 5 refills | Status: AC

## 2022-10-18 NOTE — Telephone Encounter (Signed)
Requested Prescriptions  Pending Prescriptions Disp Refills   lisinopril (ZESTRIL) 10 MG tablet [Pharmacy Med Name: LISINOPRIL 10 MG TABLET] 90 tablet 1    Sig: TAKE 1 TABLET BY MOUTH EVERY DAY     Cardiovascular:  ACE Inhibitors Passed - 10/18/2022  7:39 AM      Passed - Cr in normal range and within 180 days    Creatinine, Ser  Date Value Ref Range Status  08/20/2022 0.93 0.76 - 1.27 mg/dL Final         Passed - K in normal range and within 180 days    Potassium  Date Value Ref Range Status  08/20/2022 4.0 3.5 - 5.2 mmol/L Final         Passed - Patient is not pregnant      Passed - Last BP in normal range    BP Readings from Last 1 Encounters:  08/20/22 121/78         Passed - Valid encounter within last 6 months    Recent Outpatient Visits           1 month ago Annual physical exam   [facility], MD   8 months ago Essential hypertension   [place] [facility], Lauris Poag, MD   9 months ago Essential hypertension   [place] [facility], Lauris Poag, MD   1 year ago Class 2 obesity due to excess calories without serious comorbidity with body mass index (BMI) of 35.0 to 35.9 in adult   [facility], MD   1 year ago Impetigo    [facility] at [facility], Gildardo Pounds, NP       Future Appointments             In 4 months [name], MD [facility]

## 2022-11-05 ENCOUNTER — Telehealth: Payer: Medicaid Other | Admitting: Family Medicine

## 2022-11-05 DIAGNOSIS — K122 Cellulitis and abscess of mouth: Secondary | ICD-10-CM | POA: Diagnosis not present

## 2022-11-05 MED ORDER — CLINDAMYCIN HCL 300 MG PO CAPS: 300.0000 mg | ORAL_CAPSULE | Freq: Three times a day (TID) | ORAL | 0 refills | Status: AC

## 2022-11-05 NOTE — Patient Instructions (Signed)
Jerrye Noble, thank you for joining Freddy Finner, NP for today's virtual visit.  While this provider is not your primary care provider (PCP), if your PCP is located in our provider database this encounter information will be shared with them immediately following your visit.   A [facility] MyChart account gives you access to today's visit and all your visits, tests, and labs performed at [facility] " click here if you don't have a [facility] MyChart account or go to [email or web address]  Consent: (Patient) Billy Perry provided verbal consent for this virtual visit at the beginning of the encounter.  Current Medications:  Current Outpatient Medications:    clindamycin (CLEOCIN) 300 MG capsule, Take 1 capsule (300 mg total) by mouth 3 (three) times daily for 7 days., Disp: 21 capsule, Rfl: 0   albuterol (VENTOLIN HFA) 108 (90 Base) MCG/ACT inhaler, Inhale 2 puffs into the lungs every 6 (six) hours as needed for wheezing or shortness of breath., Disp: 8 g, Rfl: 0   cetirizine (ZYRTEC ALLERGY) 10 MG tablet, Take 1 tablet (10 mg total) by mouth daily., Disp: 30 tablet, Rfl: 5   EPINEPHrine (EPIPEN 2-PAK) 0.3 mg/0.3 mL IJ SOAJ injection, Inject 0.3 mg into the muscle as needed for up to 1 dose for anaphylaxis., Disp: 1 each, Rfl: 2   fluticasone (FLONASE) 50 MCG/ACT nasal spray, Place 2 sprays into both nostrils daily., Disp: 16 g, Rfl: 6   hydrocortisone 2.5 % ointment, Apply topically twice daily as need to red sandpapery rash., Disp: 30 g, Rfl: 0   lisinopril (ZESTRIL) 10 MG tablet, TAKE 1 TABLET BY MOUTH EVERY DAY, Disp: 90 tablet, Rfl: 1   olopatadine (PATANOL) 0.1 % ophthalmic solution, Place 1 drop into the right eye 2 (two) times daily., Disp: 5 mL, Rfl: 12   ondansetron (ZOFRAN-ODT) 4 MG disintegrating tablet, Take 1 tablet (4 mg total) by mouth every 8 (eight) hours as needed for nausea or vomiting., Disp: 20 tablet, Rfl: 0   polyethylene glycol (MIRALAX)  17 g packet, Take 17 g by mouth daily., Disp: 14 each, Rfl: 0   Medications ordered in this encounter:  Meds ordered this encounter  Medications   clindamycin (CLEOCIN) 300 MG capsule    Sig: Take 1 capsule (300 mg total) by mouth 3 (three) times daily for 7 days.    Dispense:  21 capsule    Refill:  0    Order Specific Question:   Supervising Provider    Answer:   Merrilee Jansky [id number]     *If you need refills on other medications prior to your next appointment, please contact your pharmacy*  Follow-Up: Call back or seek an in-person evaluation if the symptoms worsen or if the condition fails to improve as anticipated.  [facility] [phone]  Other Instructions  -take meds in full -rinse mouth and keep area clear of food and acidic drinks and food -avoid flossing that area until healed- gentle brushing  -follow up in person if symptoms worsen-    If you have been instructed to have an in-person evaluation today at a local Urgent Care facility, please use the link below. It will take you to a list of all of our available [facility], including address, phone number and hours of operation. Please do not delay care.  [facility]  If you or a family member do not have a primary care provider, use the link below to schedule  a visit and establish care. When you choose a [facility] primary care physician or advanced practice provider, you gain a long-term partner in health. Find a Primary Care Provider  Learn more about [facility] in-office and virtual care options: [facility] - Get Care Now

## 2022-11-05 NOTE — Progress Notes (Signed)
Virtual Visit Consent   Billy Perry, you are scheduled for a virtual visit with a [facility] provider today. Just as with appointments in the office, your consent must be obtained to participate. Your consent will be active for this visit and any virtual visit you may have with one of our providers in the next 365 days. If you have a MyChart account, a copy of this consent can be sent to you electronically.  As this is a virtual visit, video technology does not allow for your provider to perform a traditional examination. This may limit your provider's ability to fully assess your condition. If your provider identifies any concerns that need to be evaluated in person or the need to arrange testing (such as labs, EKG, etc.), we will make arrangements to do so. Although advances in technology are sophisticated, we cannot ensure that it will always work on either your end or our end. If the connection with a video visit is poor, the visit may have to be switched to a telephone visit. With either a video or telephone visit, we are not always able to ensure that we have a secure connection.  By engaging in this virtual visit, you consent to the provision of healthcare and authorize for your insurance to be billed (if applicable) for the services provided during this visit. Depending on your insurance coverage, you may receive a charge related to this service.  I need to obtain your verbal consent now. Are you willing to proceed with your visit today? Billy Perry has provided verbal consent on 11/05/2022 for a virtual visit (video or telephone). Freddy Finner, NP  Date: 11/05/2022 10:02 AM  Virtual Visit via Video Note   I, Freddy Finner, connected with  Billy Perry  ([id number], January 18, 1991) on 11/05/22 at 10:00 AM EDT by a video-enabled telemedicine application and verified that I am speaking with the correct person using two identifiers.  Location: Patient: Virtual Visit Location  Patient: Home Provider: Virtual Visit Location Provider: Home Office   I discussed the limitations of evaluation and management by telemedicine and the availability of in person appointments. The patient expressed understanding and agreed to proceed.    History of Present Illness: Billy Perry is a 32 y.o. who identifies as a male who was assigned male at birth, and is being seen today for mouth infection  Onset was last night was flossing and popped an abscess-lower left side Notes he has had this prior and it is usually on the outside of tooth area and they would go away. Mild swelling Associated symptoms are bleeding, pus, fever- max temp was 99 yesterday- gone today, pain at time- took tylenol and it helped Denies chest pain, shortness of breath, fevers, chills, chewing,    Problems:  Patient Active Problem List   Diagnosis Date Noted   Mild intermittent asthma 09/20/2021   Seasonal and perennial allergic rhinitis 09/20/2021   Pollen-food allergy 09/20/2021   History of penicillin allergy 09/20/2021   Lumbar pain 09/19/2020    Allergies:  Allergies  Allergen Reactions   Onion Shortness Of Breath and Swelling   Amoxicillin Hives    Childhood    Medications:  Current Outpatient Medications:    albuterol (VENTOLIN HFA) 108 (90 Base) MCG/ACT inhaler, Inhale 2 puffs into the lungs every 6 (six) hours as needed for wheezing or shortness of breath., Disp: 8 g, Rfl: 0   cetirizine (ZYRTEC ALLERGY) 10 MG tablet, Take 1 tablet (10 mg  total) by mouth daily., Disp: 30 tablet, Rfl: 5   EPINEPHrine (EPIPEN 2-PAK) 0.3 mg/0.3 mL IJ SOAJ injection, Inject 0.3 mg into the muscle as needed for up to 1 dose for anaphylaxis., Disp: 1 each, Rfl: 2   fluticasone (FLONASE) 50 MCG/ACT nasal spray, Place 2 sprays into both nostrils daily., Disp: 16 g, Rfl: 6   hydrocortisone 2.5 % ointment, Apply topically twice daily as need to red sandpapery rash., Disp: 30 g, Rfl: 0   lisinopril (ZESTRIL) 10  MG tablet, TAKE 1 TABLET BY MOUTH EVERY DAY, Disp: 90 tablet, Rfl: 1   olopatadine (PATANOL) 0.1 % ophthalmic solution, Place 1 drop into the right eye 2 (two) times daily., Disp: 5 mL, Rfl: 12   ondansetron (ZOFRAN-ODT) 4 MG disintegrating tablet, Take 1 tablet (4 mg total) by mouth every 8 (eight) hours as needed for nausea or vomiting., Disp: 20 tablet, Rfl: 0   polyethylene glycol (MIRALAX) 17 g packet, Take 17 g by mouth daily., Disp: 14 each, Rfl: 0  Observations/Objective: Patient is well-developed, well-nourished in no acute distress.  Resting comfortably  at home.  Head is normocephalic, atraumatic.  No labored breathing.  Speech is clear and coherent with logical content.  Patient is alert and oriented at baseline.    Assessment and Plan:   1. Mouth abscess  - clindamycin (CLEOCIN) 300 MG capsule; Take 1 capsule (300 mg total) by mouth 3 (three) times daily for 7 days.  Dispense: 21 capsule; Refill: 0  -take meds in full -rinse mouth and keep area clear of food and acidic drinks and food -avoid flossing that area until healed- gentle brushing  -follow up in person if symptoms worsen-  -strict in person precautions reviewed   Reviewed side effects, risks and benefits of medication.    Patient acknowledged agreement and understanding of the plan.   Past Medical, Surgical, Social History, Allergies, and Medications have been Reviewed.    Follow Up Instructions: I discussed the assessment and treatment plan with the patient. The patient was provided an opportunity to ask questions and all were answered. The patient agreed with the plan and demonstrated an understanding of the instructions.  A copy of instructions were sent to the patient via MyChart unless otherwise noted below.    The patient was advised to call back or seek an in-person evaluation if the symptoms worsen or if the condition fails to improve as anticipated.  Time:  I spent 10 minutes with the patient  via telehealth technology discussing the above problems/concerns.    Freddy Finner, NP

## 2022-11-08 ENCOUNTER — Encounter: Payer: Self-pay | Admitting: Family Medicine

## 2022-11-09 ENCOUNTER — Other Ambulatory Visit: Payer: Self-pay | Admitting: Family Medicine

## 2022-11-09 MED ORDER — FLUCONAZOLE 150 MG PO TABS: 150.0000 mg | ORAL_TABLET | Freq: Once | ORAL | 0 refills | Status: AC

## 2022-11-09 NOTE — Telephone Encounter (Signed)
Patient is aware of medication that PCP sent in

## 2023-02-20 ENCOUNTER — Ambulatory Visit: Payer: Medicaid Other | Admitting: Family Medicine

## 2023-02-20 VITALS — BP 122/84 | HR 60 | Temp 98.3°F | Resp 16 | Ht 72.0 in | Wt 263.0 lb

## 2023-02-20 DIAGNOSIS — Z6835 Body mass index (BMI) 35.0-35.9, adult: Secondary | ICD-10-CM

## 2023-02-20 DIAGNOSIS — E6609 Other obesity due to excess calories: Secondary | ICD-10-CM | POA: Diagnosis not present

## 2023-02-20 DIAGNOSIS — Z91018 Allergy to other foods: Secondary | ICD-10-CM

## 2023-02-20 DIAGNOSIS — I10 Essential (primary) hypertension: Secondary | ICD-10-CM | POA: Diagnosis not present

## 2023-02-20 MED ORDER — LISINOPRIL 10 MG PO TABS: 10.0000 mg | ORAL_TABLET | Freq: Every day | ORAL | 1 refills | Status: DC

## 2023-02-20 MED ORDER — EPINEPHRINE 0.3 MG/0.3ML IJ SOAJ: 0.3000 mg | INTRAMUSCULAR | 2 refills | Status: AC | PRN

## 2023-02-20 NOTE — Progress Notes (Signed)
Established Patient Office Visit  Subjective    Patient ID: Billy Perry, male    DOB: May 01, 1991  Age: 32 y.o. MRN: [id number]  CC:  Chief Complaint  Patient presents with   Medical Management of Chronic Issues    HPI Billy Perry presents for follow up of chronic med issues. Patient denies acute complaints or concerns.   Outpatient Encounter Medications as of 02/20/2023  Medication Sig   albuterol (VENTOLIN HFA) 108 (90 Base) MCG/ACT inhaler Inhale 2 puffs into the lungs every 6 (six) hours as needed for wheezing or shortness of breath.   cetirizine (ZYRTEC ALLERGY) 10 MG tablet Take 1 tablet (10 mg total) by mouth daily.   EPINEPHrine (EPIPEN 2-PAK) 0.3 mg/0.3 mL IJ SOAJ injection Inject 0.3 mg into the muscle as needed for up to 1 dose for anaphylaxis.   fluticasone (FLONASE) 50 MCG/ACT nasal spray Place 2 sprays into both nostrils daily.   hydrocortisone 2.5 % ointment Apply topically twice daily as need to red sandpapery rash.   olopatadine (PATANOL) 0.1 % ophthalmic solution Place 1 drop into the right eye 2 (two) times daily.   ondansetron (ZOFRAN-ODT) 4 MG disintegrating tablet Take 1 tablet (4 mg total) by mouth every 8 (eight) hours as needed for nausea or vomiting.   polyethylene glycol (MIRALAX) 17 g packet Take 17 g by mouth daily.   [DISCONTINUED] lisinopril (ZESTRIL) 10 MG tablet TAKE 1 TABLET BY MOUTH EVERY DAY   lisinopril (ZESTRIL) 10 MG tablet Take 1 tablet (10 mg total) by mouth daily.   No facility-administered encounter medications on file as of 02/20/2023.    Past Medical History:  Diagnosis Date   Asthma    Eczema     No past surgical history on file.  Family History  Problem Relation Age of Onset   Hypertension Mother     Social History   Socioeconomic History   Marital status: Married    Spouse name: Not on file   Number of children: Not on file   Years of education: Not on file   Highest education level: 12th grade   Occupational History   Not on file  Tobacco Use   Smoking status: Never   Smokeless tobacco: Never  Vaping Use   Vaping status: Never Used  Substance and Sexual Activity   Alcohol use: Yes    Comment: socially   Drug use: No   Sexual activity: Not on file  Other Topics Concern   Not on file  Social History Narrative   Not on file   Social Determinants of Health   Financial Resource Strain: Low Risk  (02/20/2023)   Overall Financial Resource Strain (CARDIA)    Difficulty of Paying Living Expenses: Not hard at all  Food Insecurity: No Food Insecurity (02/20/2023)   Hunger Vital Sign    Worried About Running Out of Food in the Last Year: Never true    Ran Out of Food in the Last Year: Never true  Transportation Needs: No Transportation Needs (02/20/2023)   PRAPARE - Administrator, Civil Service (Medical): No    Lack of Transportation (Non-Medical): No  Physical Activity: Insufficiently Active (02/20/2023)   Exercise Vital Sign    Days of Exercise per Week: 3 days    Minutes of Exercise per Session: 30 min  Stress: No Stress Concern Present (02/20/2023)   [employer] of Occupational Health - Occupational Stress Questionnaire    Feeling of Stress : Only a little  Social Connections: Moderately Isolated (02/20/2023)   Social Connection and Isolation Panel [NHANES]    Frequency of Communication with Friends and Family: Three times a week    Frequency of Social Gatherings with Friends and Family: More than three times a week    Attends Religious Services: Never    Database administrator or Organizations: No    Attends Engineer, structural: Not on file    Marital Status: Married  Catering manager Violence: Not on file    Review of Systems  All other systems reviewed and are negative.       Objective    BP 122/84   Pulse 60   Temp 98.3 F (36.8 C) (Oral)   Resp 16   Ht 6' (1.829 m)   Wt 263 lb (119.3 kg)   BMI 35.67 kg/m   Physical  Exam Vitals and nursing note reviewed.  Constitutional:      General: He is not in acute distress. Cardiovascular:     Rate and Rhythm: Normal rate and regular rhythm.  Pulmonary:     Effort: Pulmonary effort is normal.     Breath sounds: Normal breath sounds.  Abdominal:     Palpations: Abdomen is soft.     Tenderness: There is no abdominal tenderness.  Neurological:     General: No focal deficit present.     Mental Status: He is alert and oriented to person, place, and time.         Assessment & Plan:   1. Essential hypertension Appears stable. continue  2. Class 2 obesity due to excess calories without serious comorbidity with body mass index (BMI) of 35.0 to 35.9 in adult Discussed dietary and activity options. Goal is 3-4lbs/mo wt loss.    3. Allergy to food Epi pen prescribed   Return in about 6 months (around 08/20/2023) for follow up.   Billy Raymond, MD

## 2023-03-20 ENCOUNTER — Telehealth: Payer: Medicaid Other | Admitting: Physician Assistant

## 2023-03-20 DIAGNOSIS — R051 Acute cough: Secondary | ICD-10-CM | POA: Diagnosis not present

## 2023-03-20 DIAGNOSIS — J4521 Mild intermittent asthma with (acute) exacerbation: Secondary | ICD-10-CM

## 2023-03-20 MED ORDER — BENZONATATE 100 MG PO CAPS: 100.0000 mg | ORAL_CAPSULE | Freq: Three times a day (TID) | ORAL | 0 refills | Status: AC | PRN

## 2023-03-20 MED ORDER — PREDNISONE 20 MG PO TABS: 40.0000 mg | ORAL_TABLET | Freq: Every day | ORAL | 0 refills | Status: AC

## 2023-03-20 NOTE — Progress Notes (Signed)
E-Visit for Cough  We are sorry that you are not feeling well.  Here is how we plan to help!  Based on your presentation I believe you most likely have A cough due to a virus.  This is called viral bronchitis and is best treated by rest, plenty of fluids and control of the cough.  You may use Ibuprofen or Tylenol as directed to help your symptoms.     In addition you may use A non-prescription cough medication called Mucinex DM: take 2 tablets every 12 hours. and A prescription cough medication called Tessalon Perles 100mg . You may take 1-2 capsules every 8 hours as needed for your cough.  Prednisone 20mg  Take 2 tablets (40mg ) daily for 5 days  From your responses in the eVisit questionnaire you describe inflammation in the upper respiratory tract which is causing a significant cough.  This is commonly called Bronchitis and has four common causes:   Allergies Viral Infections Acid Reflux Bacterial Infection Allergies, viruses and acid reflux are treated by controlling symptoms or eliminating the cause. An example might be a cough caused by taking certain blood pressure medications. You stop the cough by changing the medication. Another example might be a cough caused by acid reflux. Controlling the reflux helps control the cough.  USE OF BRONCHODILATOR ("RESCUE") INHALERS: There is a risk from using your bronchodilator too frequently.  The risk is that over-reliance on a medication which only relaxes the muscles surrounding the breathing tubes can reduce the effectiveness of medications prescribed to reduce swelling and congestion of the tubes themselves.  Although you feel brief relief from the bronchodilator inhaler, your asthma may actually be worsening with the tubes becoming more swollen and filled with mucus.  This can delay other crucial treatments, such as oral steroid medications. If you need to use a bronchodilator inhaler daily, several times per day, you should discuss this with your  provider.  There are probably better treatments that could be used to keep your asthma under control.     HOME CARE Only take medications as instructed by your medical team. Complete the entire course of an antibiotic. Drink plenty of fluids and get plenty of rest. Avoid close contacts especially the very young and the elderly Cover your mouth if you cough or cough into your sleeve. Always remember to wash your hands A steam or ultrasonic humidifier can help congestion.   GET HELP RIGHT AWAY IF: You develop worsening fever. You become short of breath You cough up blood. Your symptoms persist after you have completed your treatment plan MAKE SURE YOU  Understand these instructions. Will watch your condition. Will get help right away if you are not doing well or get worse.    Thank you for choosing an e-visit.  Your e-visit answers were reviewed by a board certified advanced clinical practitioner to complete your personal care plan. Depending upon the condition, your plan could have included both over the counter or prescription medications.  Please review your pharmacy choice. Make sure the pharmacy is open so you can pick up prescription now. If there is a problem, you may contact your provider through [employer] and have the prescription routed to another pharmacy.  Your safety is important to [place]. If you have drug allergies check your prescription carefully.   For the next 24 hours you can use MyChart to ask questions about today's visit, request a non-urgent call back, or ask for a work or school excuse. You will get an  email in the next two days asking about your experience. I hope that your e-visit has been valuable and will speed your recovery.  I have spent 5 minutes in review of e-visit questionnaire, review and updating patient chart, medical decision making and response to patient.   Margaretann Loveless, PA-C

## 2023-05-16 ENCOUNTER — Ambulatory Visit
Admission: EM | Admit: 2023-05-16 | Discharge: 2023-05-16 | Disposition: A | Discharge status: Home / Self Care | Payer: Medicaid Other | Attending: Family Medicine | Admitting: Family Medicine

## 2023-05-16 ENCOUNTER — Other Ambulatory Visit: Payer: Self-pay

## 2023-05-16 ENCOUNTER — Encounter: Payer: Self-pay | Admitting: *Deleted

## 2023-05-16 ENCOUNTER — Ambulatory Visit: Payer: Self-pay

## 2023-05-16 DIAGNOSIS — R1084 Generalized abdominal pain: Secondary | ICD-10-CM

## 2023-05-16 HISTORY — DX: Essential (primary) hypertension: I10

## 2023-05-16 LAB — POCT URINALYSIS DIP (MANUAL ENTRY)
Bilirubin, UA: NEGATIVE
Blood, UA: NEGATIVE
Glucose, UA: NEGATIVE mg/dL
Ketones, POC UA: NEGATIVE mg/dL
Nitrite, UA: NEGATIVE
Protein Ur, POC: NEGATIVE mg/dL
Spec Grav, UA: 1.02 (ref 1.010–1.025)
Urobilinogen, UA: 0.2 U/dL
pH, UA: 7 (ref 5.0–8.0)

## 2023-05-16 MED ORDER — SULFAMETHOXAZOLE-TRIMETHOPRIM 800-160 MG PO TABS: 1.0000 | ORAL_TABLET | Freq: Two times a day (BID) | ORAL | 0 refills | Status: AC

## 2023-05-16 NOTE — Discharge Instructions (Addendum)

## 2023-05-16 NOTE — ED Triage Notes (Signed)
Pt reports abdominal pain x 4-5 days. Reports pain feels like a pressure and feels "sore". Denies n/v/d

## 2023-05-16 NOTE — Telephone Encounter (Signed)
  Chief Complaint: abdominal pain Symptoms: mild to moderate constant pain starting at navel radiating to sternum Frequency: 4 days ago Pertinent Negatives: Patient denies vomiting, back pain, diarrhea Disposition: [] ED /[x] Urgent Care (no appt availability in office) / [] Appointment(In office/virtual)/ []  [facility]/ [] Home Care/ [] Refused Recommended Disposition /[] [facility] [place] [facility]/ []  Follow-up with PCP Additional Notes: UC per disposition  Reason for Disposition  [1] MILD-MODERATE pain AND [2] constant AND [3] present > 2 hours  Answer Assessment - Initial Assessment Questions 1. LOCATION: "Where does it hurt?"      Mid sternum to navel  2. RADIATION: "Does the pain shoot anywhere else?" (e.g., chest, back)     Navel to chest  3. ONSET: "When did the pain begin?" (Minutes, hours or days ago)      4 days ago 4. SUDDEN: "Gradual or sudden onset?"     gradually 5. PATTERN "Does the pain come and go, or is it constant?"    - If it comes and goes: "How long does it last?" "Do you have pain now?"     (Note: Comes and goes means the pain is intermittent. It goes away completely between bouts.)    - If constant: "Is it getting better, staying the same, or getting worse?"      (Note: Constant means the pain never goes away completely; most serious pain is constant and gets worse.)      Constant  6. SEVERITY: "How bad is the pain?"  (e.g., Scale 1-10; mild, moderate, or severe)    - MILD (1-3): Doesn't interfere with normal activities, abdomen soft and not tender to touch.     - MODERATE (4-7): Interferes with normal activities or awakens from sleep, abdomen tender to touch.     - SEVERE (8-10): Excruciating pain, doubled over, unable to do any normal activities.       Varies 2 to 6  10. OTHER SYMPTOMS: "Do you have any other symptoms?" (e.g., back pain, diarrhea, fever, urination pain, vomiting)       no  Protocols used: Abdominal Pain - Male-A-AH

## 2023-05-16 NOTE — ED Provider Notes (Signed)
[facility]   [id number] 05/16/23 Arrival Time: 0911  ASSESSMENT & PLAN:  1. Generalized abdominal pain    Labs Reviewed  POCT URINALYSIS DIP (MANUAL ENTRY) - Abnormal; Notable for the following components:      Result Value   Leukocytes, UA Small (1+) (*)    All other components within normal limits  URINE CULTURE  CBC WITH DIFFERENTIAL/PLATELET  COMPREHENSIVE METABOLIC PANEL  LIPASE, BLOOD   Ques cystitis. Will empirically tx until culture available. Benign abdominal exam. No indications for urgent abdominal/pelvic imaging at this time. Discussed.  Meds ordered this encounter  Medications   sulfamethoxazole-trimethoprim (BACTRIM DS) 800-160 MG tablet    Sig: Take 1 tablet by mouth 2 (two) times daily for 7 days.    Dispense:  14 tablet    Refill:  0     Discharge Instructions      You have been seen today for abdominal pain. Your evaluation was not suggestive of any emergent condition requiring medical intervention at this time. However, some abdominal problems make take more time to appear. Therefore, it is very important for you to pay attention to any new symptoms or worsening of your current condition.  Please return here or to the Emergency Department immediately should you begin to feel worse in any way or have any of the following symptoms: increasing or different abdominal pain, persistent vomiting, inability to drink fluids, fevers, or shaking chills.   You have had labs (blood tests and urine culture) sent today. We will call you with any significant abnormalities or if there is need to begin or change treatment or pursue further follow up.  You may also review your test results online through MyChart. If you do not have a MyChart account, instructions to sign up should be on your discharge paperwork.     Follow-up Information     Schedule an appointment as soon as possible for a visit  with [name], MD.   Specialty: Family Medicine Why:  For follow up. Contact information: [address] [place] [address] [phone]         [facility] Emergency Department at [facility].   Specialty: Emergency Medicine Why: If symptoms worsen in any way. Contact information: [address] [place] [address] [phone]                Reviewed expectations re: course of current medical issues. Questions answered. Outlined signs and symptoms indicating need for more acute intervention. Patient verbalized understanding. After Visit Summary given.   SUBJECTIVE: History from: patient. Billy Perry is a 32 y.o. male who presents with complaint of lower abdominal discomfort; gradual onset; x 5-6 days; stable and not worsening. Denies fever/n/v/d. Normal non-bloody bowel movements. Denies specific urinary symptoms. No tx PTA.   OBJECTIVE:  Vitals:   05/16/23 1014  BP: 118/75  Pulse: (!) 59  Resp: 18  Temp: 97.8 F (36.6 C)  TempSrc: Oral  SpO2: 96%    General appearance: alert, oriented, no acute distress HEENT: [place]; AT; oropharynx moist Lungs: unlabored respirations Abdomen: soft; without distention; mild tenderness over suprapubic abdomen; normal bowel sounds; without masses or organomegaly; without guarding or rebound tenderness Back: without reported CVA tenderness; FROM at waist Extremities: without LE edema; symmetrical; without gross deformities Skin: warm and dry Neurologic: normal gait Psychological: alert and cooperative; normal mood and affect  Labs: Results for orders placed or performed during the hospital encounter of 05/16/23  POCT urinalysis  dipstick   Collection Time: 05/16/23 11:15 AM  Result Value Ref Range   Color, UA yellow yellow   Clarity, UA clear clear   Glucose, UA negative negative mg/dL   Bilirubin, UA negative negative   Ketones, POC UA negative negative mg/dL   Spec Grav, UA 8.413 2.440 - 1.025   Blood, UA negative  negative   pH, UA 7.0 5.0 - 8.0   Protein Ur, POC negative negative mg/dL   Urobilinogen, UA 0.2 0.2 or 1.0 E.U./dL   Nitrite, UA Negative Negative   Leukocytes, UA Small (1+) (A) Negative   Labs Reviewed  POCT URINALYSIS DIP (MANUAL ENTRY) - Abnormal; Notable for the following components:      Result Value   Leukocytes, UA Small (1+) (*)    All other components within normal limits  URINE CULTURE  CBC WITH DIFFERENTIAL/PLATELET  COMPREHENSIVE METABOLIC PANEL  LIPASE, BLOOD    Imaging: No results found.   Allergies  Allergen Reactions   Onion Shortness Of Breath and Swelling   Amoxicillin Hives    Childhood                                                Past Medical History:  Diagnosis Date   Asthma    Eczema    Hypertension     Social History   Socioeconomic History   Marital status: Married    Spouse name: Not on file   Number of children: Not on file   Years of education: Not on file   Highest education level: 12th grade  Occupational History   Not on file  Tobacco Use   Smoking status: Never   Smokeless tobacco: Never  Vaping Use   Vaping status: Never Used  Substance and Sexual Activity   Alcohol use: Yes    Comment: socially   Drug use: No   Sexual activity: Not on file  Other Topics Concern   Not on file  Social History Narrative   Not on file   Social Drivers of Health   Financial Resource Strain: Low Risk  (02/20/2023)   Overall Financial Resource Strain (CARDIA)    Difficulty of Paying Living Expenses: Not hard at all  Food Insecurity: No Food Insecurity (02/20/2023)   Hunger Vital Sign    Worried About Running Out of Food in the Last Year: Never true    Ran Out of Food in the Last Year: Never true  Transportation Needs: No Transportation Needs (02/20/2023)   PRAPARE - Administrator, Civil Service (Medical): No    Lack of Transportation (Non-Medical): No  Physical Activity: Insufficiently Active (02/20/2023)   Exercise Vital  Sign    Days of Exercise per Week: 3 days    Minutes of Exercise per Session: 30 min  Stress: No Stress Concern Present (02/20/2023)   [employer] of Occupational Health - Occupational Stress Questionnaire    Feeling of Stress : Only a little  Social Connections: Moderately Isolated (02/20/2023)   Social Connection and Isolation Panel [NHANES]    Frequency of Communication with Friends and Family: Three times a week    Frequency of Social Gatherings with Friends and Family: More than three times a week    Attends Religious Services: Never    Database administrator or Organizations: No    Attends Banker  Meetings: Not on file    Marital Status: Married  Intimate Partner Violence: Not on file    Family History  Problem Relation Age of Onset   Hypertension Mother      Mardella Layman, MD 05/16/23 [phone]

## 2023-05-17 LAB — CBC WITH DIFFERENTIAL/PLATELET
Basophils Absolute: 0 10*3/uL (ref 0.0–0.2)
Basos: 1 %
EOS (ABSOLUTE): 0.1 10*3/uL (ref 0.0–0.4)
Eos: 2 %
Hematocrit: 49.5 % (ref 37.5–51.0)
Hemoglobin: 16.1 g/dL (ref 13.0–17.7)
Immature Grans (Abs): 0 10*3/uL (ref 0.0–0.1)
Immature Granulocytes: 0 %
Lymphocytes Absolute: 1 10*3/uL (ref 0.7–3.1)
Lymphs: 23 %
MCH: 29.2 pg (ref 26.6–33.0)
MCHC: 32.5 g/dL (ref 31.5–35.7)
MCV: 90 fL (ref 79–97)
Monocytes Absolute: 0.3 10*3/uL (ref 0.1–0.9)
Monocytes: 7 %
Neutrophils Absolute: 3 10*3/uL (ref 1.4–7.0)
Neutrophils: 67 %
Platelets: 241 10*3/uL (ref 150–450)
RBC: 5.51 x10E6/uL (ref 4.14–5.80)
RDW: 12.1 % (ref 11.6–15.4)
WBC: 4.5 10*3/uL (ref 3.4–10.8)

## 2023-05-17 LAB — URINE CULTURE: Culture: NO GROWTH

## 2023-05-17 LAB — COMPREHENSIVE METABOLIC PANEL
ALT: 22 [IU]/L (ref 0–44)
AST: 22 [IU]/L (ref 0–40)
Albumin: 4.4 g/dL (ref 4.1–5.1)
Alkaline Phosphatase: 79 [IU]/L (ref 44–121)
BUN/Creatinine Ratio: 7 — ABNORMAL LOW (ref 9–20)
BUN: 8 mg/dL (ref 6–20)
Bilirubin Total: 0.8 mg/dL (ref 0.0–1.2)
CO2: 21 mmol/L (ref 20–29)
Calcium: 9.3 mg/dL (ref 8.7–10.2)
Chloride: 100 mmol/L (ref 96–106)
Creatinine, Ser: 1.13 mg/dL (ref 0.76–1.27)
Globulin, Total: 2.9 g/dL (ref 1.5–4.5)
Glucose: 92 mg/dL (ref 70–99)
Potassium: 4.5 mmol/L (ref 3.5–5.2)
Sodium: 138 mmol/L (ref 134–144)
Total Protein: 7.3 g/dL (ref 6.0–8.5)
eGFR: 89 mL/min/{1.73_m2} (ref >59)

## 2023-07-30 ENCOUNTER — Telehealth: Payer: PRIVATE HEALTH INSURANCE | Admitting: Physician Assistant

## 2023-07-30 DIAGNOSIS — K047 Periapical abscess without sinus: Secondary | ICD-10-CM

## 2023-07-30 MED ORDER — CHLORHEXIDINE GLUCONATE 0.12 % MT SOLN
15.0000 mL | Freq: Two times a day (BID) | OROMUCOSAL | 0 refills | Status: AC
Start: 2023-07-30 — End: ?

## 2023-07-30 MED ORDER — CLINDAMYCIN HCL 300 MG PO CAPS: 300.0000 mg | ORAL_CAPSULE | Freq: Three times a day (TID) | ORAL | 0 refills | Status: AC

## 2023-07-30 NOTE — Patient Instructions (Signed)
 Jerrye Noble, thank you for joining Margaretann Loveless, PA-C for today's virtual visit.  While this provider is not your primary care provider (PCP), if your PCP is located in our provider database this encounter information will be shared with them immediately following your visit.   A [facility] MyChart account gives you access to today's visit and all your visits, tests, and labs performed at [facility] " click here if you don't have a [facility] MyChart account or go to [email or web address]  Consent: (Patient) Billy Perry provided verbal consent for this virtual visit at the beginning of the encounter.  Current Medications:  Current Outpatient Medications:    chlorhexidine (PERIDEX) 0.12 % solution, Use as directed 15 mLs in the mouth or throat 2 (two) times daily., Disp: 120 mL, Rfl: 0   clindamycin (CLEOCIN) 300 MG capsule, Take 1 capsule (300 mg total) by mouth 3 (three) times daily for 7 days., Disp: 21 capsule, Rfl: 0   albuterol (VENTOLIN HFA) 108 (90 Base) MCG/ACT inhaler, Inhale 2 puffs into the lungs every 6 (six) hours as needed for wheezing or shortness of breath., Disp: 8 g, Rfl: 0   benzonatate (TESSALON) 100 MG capsule, Take 1-2 capsules (100-200 mg total) by mouth 3 (three) times daily as needed. (Patient not taking: Reported on 05/16/2023), Disp: 30 capsule, Rfl: 0   cetirizine (ZYRTEC ALLERGY) 10 MG tablet, Take 1 tablet (10 mg total) by mouth daily., Disp: 30 tablet, Rfl: 5   EPINEPHrine (EPIPEN 2-PAK) 0.3 mg/0.3 mL IJ SOAJ injection, Inject 0.3 mg into the muscle as needed for up to 1 dose for anaphylaxis., Disp: 1 each, Rfl: 2   fluticasone (FLONASE) 50 MCG/ACT nasal spray, Place 2 sprays into both nostrils daily., Disp: 16 g, Rfl: 6   hydrocortisone 2.5 % ointment, Apply topically twice daily as need to red sandpapery rash., Disp: 30 g, Rfl: 0   lisinopril (ZESTRIL) 10 MG tablet, Take 1 tablet (10 mg total) by mouth daily., Disp: 90  tablet, Rfl: 1   olopatadine (PATANOL) 0.1 % ophthalmic solution, Place 1 drop into the right eye 2 (two) times daily. (Patient not taking: Reported on 05/16/2023), Disp: 5 mL, Rfl: 12   ondansetron (ZOFRAN-ODT) 4 MG disintegrating tablet, Take 1 tablet (4 mg total) by mouth every 8 (eight) hours as needed for nausea or vomiting. (Patient not taking: Reported on 05/16/2023), Disp: 20 tablet, Rfl: 0   polyethylene glycol (MIRALAX) 17 g packet, Take 17 g by mouth daily. (Patient not taking: Reported on 05/16/2023), Disp: 14 each, Rfl: 0   predniSONE (DELTASONE) 20 MG tablet, Take 2 tablets (40 mg total) by mouth daily with breakfast. (Patient not taking: Reported on 05/16/2023), Disp: 10 tablet, Rfl: 0   Medications ordered in this encounter:  Meds ordered this encounter  Medications   clindamycin (CLEOCIN) 300 MG capsule    Sig: Take 1 capsule (300 mg total) by mouth 3 (three) times daily for 7 days.    Dispense:  21 capsule    Refill:  0    Supervising Provider:   Merrilee Jansky [[id number]]   chlorhexidine (PERIDEX) 0.12 % solution    Sig: Use as directed 15 mLs in the mouth or throat 2 (two) times daily.    Dispense:  120 mL    Refill:  0    Supervising Provider:   Merrilee Jansky [[id number]]     *If you need refills on other medications prior to your next appointment, please contact  your pharmacy*  Follow-Up: Call back or seek an in-person evaluation if the symptoms worsen or if the condition fails to improve as anticipated.  [facility] [phone]  Other Instructions Dental Abscess  A dental abscess is an infection around a tooth that may involve pain, swelling, and a collection of pus, as well as other symptoms. Treatment is important to help with symptoms and to prevent the infection from spreading. The general types of dental abscesses are: Pulpal abscess. This abscess may form from the inner part of the tooth (pulp). Periodontal abscess. This abscess may  form from the gum. What are the causes? This condition is caused by a bacterial infection in or around the tooth. It may result from: Severe tooth decay (cavities). Trauma to the tooth, such as a broken or chipped tooth. What increases the risk? This condition is more likely to develop in males. It is also more likely to develop in people who: Have cavities. Have severe gum disease. Eat sugary snacks between meals. Use tobacco products. Have diabetes. Have a weakened disease-fighting system (immune system). Do not brush and care for their teeth regularly. What are the signs or symptoms? Mild symptoms of this condition include: Tenderness. Bad breath. Fever. A bitter taste in the mouth. Pain in and around the infected tooth. Moderate symptoms of this condition include: Swollen neck glands. Chills. Pus drainage. Swelling and redness around the infected tooth, in the mouth, or in the face. Severe pain in and around the infected tooth. Severe symptoms of this condition include: Difficulty swallowing. Difficulty opening the mouth. Nausea. Vomiting. How is this diagnosed? This condition is diagnosed based on: Your symptoms and your medical and dental history. An examination of the infected tooth. During the exam, your dental care provider may tap on the infected tooth. You may also need to have X-rays taken of the affected area. How is this treated? This condition is treated by getting rid of the infection. This may be done with: Antibiotic medicines. These may be used in certain situations. Antibacterial mouth rinse. Incision and drainage. This procedure is done by making an incision in the abscess to drain out the pus. Removing pus is the first priority in treating an abscess. A root canal. This may be performed to save the tooth. Your dental care provider accesses the visible part of your tooth (crown) with a drill and removes any infected pulp. Then the space is filled and  sealed off. Tooth extraction. The tooth is pulled out if it cannot be saved by other treatment. You may also receive treatment for pain, such as: Acetaminophen or NSAIDs. Gels that contain a numbing medicine. An injection to block the pain near your nerve. Follow these instructions at home: Medicines Take over-the-counter and prescription medicines only as told by your dental care provider. If you were prescribed an antibiotic, take it as told by your dental care provider. Do not stop taking the antibiotic even if you start to feel better. If you were prescribed a gel that contains a numbing medicine, use it exactly as told in the directions. Do not use these gels for children who are younger than 58 years of age. Use an antibacterial mouth rinse as told by your dental care provider. General instructions  Gargle with a mixture of salt and water 3-4 times a day or as needed. To make salt water, completely dissolve -1 tsp (3-6 g) of salt in 1 cup (237 mL) of warm water. Eat a soft diet  while your abscess is healing. Drink enough fluid to keep your urine pale yellow. Do not apply heat to the outside of your mouth. Do not use any products that contain nicotine or tobacco. These products include cigarettes, chewing tobacco, and vaping devices, such as e-cigarettes. If you need help quitting, ask your dental care provider. Keep all follow-up visits. This is important. How is this prevented?  Excellent dental home care, which includes brushing your teeth every morning and night with fluoride toothpaste. Floss one time each day. Get regularly scheduled dental cleanings. Consider having a dental sealant applied on teeth that have deep grooves to prevent cavities. Drink fluoridated water regularly. This includes most tap water. Check the label on bottled water to see if it contains fluoride. Reduce or eliminate sugary drinks. Eat healthy meals and snacks. Wear a mouth guard or face shield to  protect your teeth while playing sports. Contact a health care provider if: Your pain is worse and is not helped by medicine. You have swelling. You see pus around the tooth. You have a fever or chills. Get help right away if: Your symptoms suddenly get worse. You have a very bad headache. You have problems breathing or swallowing. You have trouble opening your mouth. You have swelling in your neck or around your eye. These symptoms may represent a serious problem that is an emergency. Do not wait to see if the symptoms will go away. Get medical help right away. Call your local emergency services (911 in the [place] Do not drive yourself to the hospital. Summary A dental abscess is a collection of pus in or around a tooth that results from an infection. A dental abscess may result from severe tooth decay, trauma to the tooth, or severe gum disease around a tooth. Symptoms include severe pain, swelling, redness, and drainage of pus in and around the infected tooth. The first priority in treating a dental abscess is to drain out the pus. Treatment may also involve removing damage inside the tooth (root canal) or extracting the tooth. This information is not intended to replace advice given to you by your health care provider. Make sure you discuss any questions you have with your health care provider. Document Revised: 07/21/2020 Document Reviewed: 07/21/2020 Elsevier Patient Education  2024 Elsevier Inc.   If you have been instructed to have an in-person evaluation today at a local Urgent Care facility, please use the link below. It will take you to a list of all of our available [facility], including address, phone number and hours of operation. Please do not delay care.  [facility]  If you or a family member do not have a primary care provider, use the link below to schedule a visit and establish care. When you choose a [facility] primary care physician or  advanced practice provider, you gain a long-term partner in health. Find a Primary Care Provider  Learn more about [facility]'s in-office and virtual care options: [place] [facility] - Get Care Now

## 2023-07-30 NOTE — Progress Notes (Signed)
 Virtual Visit Consent   ARMANY MANO, you are scheduled for a virtual visit with a [facility] provider today. Just as with appointments in the office, your consent must be obtained to participate. Your consent will be active for this visit and any virtual visit you may have with one of our providers in the next 365 days. If you have a MyChart account, a copy of this consent can be sent to you electronically.  As this is a virtual visit, video technology does not allow for your provider to perform a traditional examination. This may limit your provider's ability to fully assess your condition. If your provider identifies any concerns that need to be evaluated in person or the need to arrange testing (such as labs, EKG, etc.), we will make arrangements to do so. Although advances in technology are sophisticated, we cannot ensure that it will always work on either your end or our end. If the connection with a video visit is poor, the visit may have to be switched to a telephone visit. With either a video or telephone visit, we are not always able to ensure that we have a secure connection.  By engaging in this virtual visit, you consent to the provision of healthcare and authorize for your insurance to be billed (if applicable) for the services provided during this visit. Depending on your insurance coverage, you may receive a charge related to this service.  I need to obtain your verbal consent now. Are you willing to proceed with your visit today? JULY NICKSON has provided verbal consent on 07/30/2023 for a virtual visit (video or telephone). Margaretann Loveless, PA-C  Date: 07/30/2023 9:23 AM   Virtual Visit via Video Note   I, Margaretann Loveless, connected with  GOODWIN KAMPHAUS  ([id number], 11-May-1991) on 07/30/23 at  9:15 AM EST by a video-enabled telemedicine application and verified that I am speaking with the correct person using two identifiers.  Location: Patient: Virtual  Visit Location Patient: Home Provider: Virtual Visit Location Provider: Home Office   I discussed the limitations of evaluation and management by telemedicine and the availability of in person appointments. The patient expressed understanding and agreed to proceed.    History of Present Illness: COHEN BOETTNER is a 33 y.o. who identifies as a male who was assigned male at birth, and is being seen today for dental abscess that ruptured.  HPI: Dental Pain  This is a new problem. The current episode started in the past 7 days (Had abscess in gumline that ruptured and now with more systemic symptoms, popped over the weekend). The problem occurs constantly. The problem has been unchanged. The pain is moderate. Associated symptoms include facial pain (left cheek), oral bleeding, sinus pressure and thermal sensitivity (liquids bother more). Pertinent negatives include no difficulty swallowing or fever. Associated symptoms comments: Can still taste blood from it, having irritation in the area when brushing his teeth. He has tried NSAIDs (ibuprofen) for the symptoms. The treatment provided no relief.     Problems:  Patient Active Problem List   Diagnosis Date Noted   Mild intermittent asthma 09/20/2021   Seasonal and perennial allergic rhinitis 09/20/2021   Pollen-food allergy 09/20/2021   History of penicillin allergy 09/20/2021   Lumbar pain 09/19/2020    Allergies:  Allergies  Allergen Reactions   Onion Shortness Of Breath and Swelling   Amoxicillin Hives    Childhood    Medications:  Current Outpatient Medications:    chlorhexidine (  PERIDEX) 0.12 % solution, Use as directed 15 mLs in the mouth or throat 2 (two) times daily., Disp: 120 mL, Rfl: 0   clindamycin (CLEOCIN) 300 MG capsule, Take 1 capsule (300 mg total) by mouth 3 (three) times daily for 7 days., Disp: 21 capsule, Rfl: 0   albuterol (VENTOLIN HFA) 108 (90 Base) MCG/ACT inhaler, Inhale 2 puffs into the lungs every 6 (six)  hours as needed for wheezing or shortness of breath., Disp: 8 g, Rfl: 0   benzonatate (TESSALON) 100 MG capsule, Take 1-2 capsules (100-200 mg total) by mouth 3 (three) times daily as needed. (Patient not taking: Reported on 05/16/2023), Disp: 30 capsule, Rfl: 0   cetirizine (ZYRTEC ALLERGY) 10 MG tablet, Take 1 tablet (10 mg total) by mouth daily., Disp: 30 tablet, Rfl: 5   EPINEPHrine (EPIPEN 2-PAK) 0.3 mg/0.3 mL IJ SOAJ injection, Inject 0.3 mg into the muscle as needed for up to 1 dose for anaphylaxis., Disp: 1 each, Rfl: 2   fluticasone (FLONASE) 50 MCG/ACT nasal spray, Place 2 sprays into both nostrils daily., Disp: 16 g, Rfl: 6   hydrocortisone 2.5 % ointment, Apply topically twice daily as need to red sandpapery rash., Disp: 30 g, Rfl: 0   lisinopril (ZESTRIL) 10 MG tablet, Take 1 tablet (10 mg total) by mouth daily., Disp: 90 tablet, Rfl: 1   olopatadine (PATANOL) 0.1 % ophthalmic solution, Place 1 drop into the right eye 2 (two) times daily. (Patient not taking: Reported on 05/16/2023), Disp: 5 mL, Rfl: 12   ondansetron (ZOFRAN-ODT) 4 MG disintegrating tablet, Take 1 tablet (4 mg total) by mouth every 8 (eight) hours as needed for nausea or vomiting. (Patient not taking: Reported on 05/16/2023), Disp: 20 tablet, Rfl: 0   polyethylene glycol (MIRALAX) 17 g packet, Take 17 g by mouth daily. (Patient not taking: Reported on 05/16/2023), Disp: 14 each, Rfl: 0   predniSONE (DELTASONE) 20 MG tablet, Take 2 tablets (40 mg total) by mouth daily with breakfast. (Patient not taking: Reported on 05/16/2023), Disp: 10 tablet, Rfl: 0  Observations/Objective: Patient is well-developed, well-nourished in no acute distress.  Resting comfortably at home.  Head is normocephalic, atraumatic.  No labored breathing.  Speech is clear and coherent with logical content.  Patient is alert and oriented at baseline.    Assessment and Plan: 1. Dental infection (Primary) - clindamycin (CLEOCIN) 300 MG capsule;  Take 1 capsule (300 mg total) by mouth 3 (three) times daily for 7 days.  Dispense: 21 capsule; Refill: 0 - chlorhexidine (PERIDEX) 0.12 % solution; Use as directed 15 mLs in the mouth or throat 2 (two) times daily.  Dispense: 120 mL; Refill: 0  - Suspected infection with open and draining abscess - Clindamycin prescribed - Peridex mouth rinse also prescribed - Can use ice on outside jaw/cheek for swelling - Can also take tylenol for pain with other medications - Schedule a follow with a dentist as soon as possible (Can contact  dental clinic if underinsured or uninsured) - Seek in person evaluation if symptoms fail to improve or if they worsen   Follow Up Instructions: I discussed the assessment and treatment plan with the patient. The patient was provided an opportunity to ask questions and all were answered. The patient agreed with the plan and demonstrated an understanding of the instructions.  A copy of instructions were sent to the patient via MyChart unless otherwise noted below.    The patient was advised to call back or seek an in-person  evaluation if the symptoms worsen or if the condition fails to improve as anticipated.    Margaretann Loveless, PA-C

## 2023-08-20 ENCOUNTER — Ambulatory Visit: Payer: PRIVATE HEALTH INSURANCE | Admitting: Family Medicine

## 2023-08-20 ENCOUNTER — Encounter: Payer: Self-pay | Admitting: Family Medicine

## 2023-08-20 VITALS — BP 119/77 | HR 62 | Temp 98.1°F | Resp 18 | Ht 72.0 in | Wt 267.6 lb

## 2023-08-20 DIAGNOSIS — E66812 Obesity, class 2: Secondary | ICD-10-CM

## 2023-08-20 DIAGNOSIS — E6609 Other obesity due to excess calories: Secondary | ICD-10-CM

## 2023-08-20 DIAGNOSIS — F341 Dysthymic disorder: Secondary | ICD-10-CM | POA: Diagnosis not present

## 2023-08-20 DIAGNOSIS — Z6835 Body mass index (BMI) 35.0-35.9, adult: Secondary | ICD-10-CM

## 2023-08-20 DIAGNOSIS — I10 Essential (primary) hypertension: Secondary | ICD-10-CM | POA: Diagnosis not present

## 2023-08-20 MED ORDER — LISINOPRIL 10 MG PO TABS: 10.0000 mg | ORAL_TABLET | Freq: Every day | ORAL | 1 refills | Status: DC

## 2023-08-20 NOTE — Progress Notes (Unsigned)
 Established Patient Office Visit  Subjective    Patient ID: Billy Perry, male    DOB: 1991-05-11  Age: 33 y.o. MRN: [id number]  CC:  Chief Complaint  Patient presents with   Follow-up    6 month    HPI Billy BRUMMET presents for routine follow up of hypertension. Patient reports med compliance and denies acute complaints.   Outpatient Encounter Medications as of 08/20/2023  Medication Sig   albuterol (VENTOLIN HFA) 108 (90 Base) MCG/ACT inhaler Inhale 2 puffs into the lungs every 6 (six) hours as needed for wheezing or shortness of breath.   cetirizine (ZYRTEC ALLERGY) 10 MG tablet Take 1 tablet (10 mg total) by mouth daily.   chlorhexidine (PERIDEX) 0.12 % solution Use as directed 15 mLs in the mouth or throat 2 (two) times daily.   EPINEPHrine (EPIPEN 2-PAK) 0.3 mg/0.3 mL IJ SOAJ injection Inject 0.3 mg into the muscle as needed for up to 1 dose for anaphylaxis.   fluticasone (FLONASE) 50 MCG/ACT nasal spray Place 2 sprays into both nostrils daily.   hydrocortisone 2.5 % ointment Apply topically twice daily as need to red sandpapery rash.   lisinopril (ZESTRIL) 10 MG tablet Take 1 tablet (10 mg total) by mouth daily.   benzonatate (TESSALON) 100 MG capsule Take 1-2 capsules (100-200 mg total) by mouth 3 (three) times daily as needed. (Patient not taking: Reported on 05/16/2023)   olopatadine (PATANOL) 0.1 % ophthalmic solution Place 1 drop into the right eye 2 (two) times daily. (Patient not taking: Reported on 05/16/2023)   ondansetron (ZOFRAN-ODT) 4 MG disintegrating tablet Take 1 tablet (4 mg total) by mouth every 8 (eight) hours as needed for nausea or vomiting. (Patient not taking: Reported on 05/16/2023)   polyethylene glycol (MIRALAX) 17 g packet Take 17 g by mouth daily. (Patient not taking: Reported on 05/16/2023)   predniSONE (DELTASONE) 20 MG tablet Take 2 tablets (40 mg total) by mouth daily with breakfast. (Patient not taking: Reported on 05/16/2023)   No  facility-administered encounter medications on file as of 08/20/2023.    Past Medical History:  Diagnosis Date   Asthma    Eczema    Hypertension     History reviewed. No pertinent surgical history.  Family History  Problem Relation Age of Onset   Hypertension Mother     Social History   Socioeconomic History   Marital status: Married    Spouse name: Not on file   Number of children: Not on file   Years of education: Not on file   Highest education level: 12th grade  Occupational History   Not on file  Tobacco Use   Smoking status: Never   Smokeless tobacco: Never  Vaping Use   Vaping status: Never Used  Substance and Sexual Activity   Alcohol use: Yes    Comment: socially   Drug use: No   Sexual activity: Not on file  Other Topics Concern   Not on file  Social History Narrative   Not on file   Social Drivers of Health   Financial Resource Strain: Low Risk  (02/20/2023)   Overall Financial Resource Strain (CARDIA)    Difficulty of Paying Living Expenses: Not hard at all  Food Insecurity: No Food Insecurity (02/20/2023)   Hunger Vital Sign    Worried About Running Out of Food in the Last Year: Never true    Ran Out of Food in the Last Year: Never true  Transportation Needs: No Transportation Needs (  02/20/2023)   PRAPARE - Administrator, Civil Service (Medical): No    Lack of Transportation (Non-Medical): No  Physical Activity: Insufficiently Active (02/20/2023)   Exercise Vital Sign    Days of Exercise per Week: 3 days    Minutes of Exercise per Session: 30 min  Stress: No Stress Concern Present (02/20/2023)   [employer] of Occupational Health - Occupational Stress Questionnaire    Feeling of Stress : Only a little  Social Connections: Moderately Isolated (02/20/2023)   Social Connection and Isolation Panel [NHANES]    Frequency of Communication with Friends and Family: Three times a week    Frequency of Social Gatherings with Friends and  Family: More than three times a week    Attends Religious Services: Never    Database administrator or Organizations: No    Attends Engineer, structural: Not on file    Marital Status: Married  Catering manager Violence: Not on file    Review of Systems  All other systems reviewed and are negative.       Objective    BP 119/77   Pulse 62   Temp 98.1 F (36.7 C) (Oral)   Resp 18   Ht 6' (1.829 m)   Wt 267 lb 9.6 oz (121.4 kg)   SpO2 94%   BMI 36.29 kg/m   Physical Exam Vitals and nursing note reviewed.  Constitutional:      General: He is not in acute distress. Cardiovascular:     Rate and Rhythm: Normal rate and regular rhythm.  Pulmonary:     Effort: Pulmonary effort is normal.     Breath sounds: Normal breath sounds.  Abdominal:     Palpations: Abdomen is soft.     Tenderness: There is no abdominal tenderness.  Neurological:     General: No focal deficit present.     Mental Status: He is alert and oriented to person, place, and time.         Assessment & Plan:   1. Essential hypertension (Primary) Appears stable. Continue. Meds refilled   2. Class 2 obesity due to excess calories without serious comorbidity with body mass index (BMI) of 35.0 to 35.9 in adult   3. Dysthymia Appears stable without meds.     No follow-ups on file.   Tommie Raymond, MD

## 2023-08-21 ENCOUNTER — Encounter: Payer: Self-pay | Admitting: Family Medicine

## 2023-11-23 ENCOUNTER — Telehealth: Payer: PRIVATE HEALTH INSURANCE

## 2023-11-23 DIAGNOSIS — H60332 Swimmer's ear, left ear: Secondary | ICD-10-CM | POA: Diagnosis not present

## 2023-11-24 MED ORDER — CIPRO HC 0.2-1 % OT SUSP: 3.0000 [drp] | Freq: Two times a day (BID) | OTIC | 0 refills | Status: AC

## 2023-11-24 NOTE — Progress Notes (Signed)
 E Visit for Ear Pain - Swimmer's Ear  We are sorry that you are not feeling well. Here is how we plan to help!  Based on what you have shared with me it looks like you have Swimmer's Ear.  Swimmer's ear is a redness or swelling, irritation, or infection of your outer ear canal. These symptoms usually occur within a few days of swimming. Your ear canal is a tube that goes from the opening of the ear to the eardrum.  When water stays in your ear canal, germs can grow.  This is a painful condition that often happens to children and swimmers of all ages.  It is not contagious and oral antibiotics are not required to treat uncomplicated swimmer's ear.  The usual symptoms include:    Itchiness inside the ear  Redness or a sense of swelling in the ear  Pain when the ear is tugged on when pressure is placed on the ear  Pus draining from the infected ear     I have prescribed: Ciprofloxin 0.2% and hydrocortisone  1% otic suspension 3 drops in affected ears twice daily for 7 days  In certain cases, swimmer's ear may progress to a more serious bacterial infection of the middle or inner ear.  If you have a fever 102 and up and significantly worsening symptoms, this could indicate a more serious infection moving to the middle/inner and needs face to face evaluation in an office by a provider.  Your symptoms should improve over the next 3 days and should resolve in about 7 days.  Be sure to complete ALL of your prescription.  HOME CARE: Wash your hands frequently. If you are prescribed an ear drop, do not place the tip of the bottle on your ear or touch it with your fingers. You can take Acetaminophen  650 mg every 4-6 hours as needed for pain.  If pain is severe or moderate, you can apply a heating pad (set on low) or hot water bottle (wrapped in a towel) to outer ear for 20 minutes.  This will also increase drainage. Avoid ear plugs Do not go swimming until the symptoms are gone Do not use Q-tips After  showers, help the water run out by tilting your head to one side.   GET HELP RIGHT AWAY IF: Fever is over 102.2 degrees. You develop progressive ear pain or hearing loss. Ear symptoms persist longer than 3 days after treatment.  MAKE SURE YOU: Understand these instructions. Will watch your condition. Will get help right away if you are not doing well or get worse.  TO PREVENT SWIMMER'S EAR: Use a bathing cap or custom fitted swim molds to keep your ears dry. Towel off after swimming to dry your ears. Tilt your head or pull your earlobes to allow the water to escape your ear canal. If there is still water in your ears, consider using a hairdryer on the lowest setting.  Thank you for choosing an e-visit.  Your e-visit answers were reviewed by a board certified advanced clinical practitioner to complete your personal care plan. Depending upon the condition, your plan could have included both over the counter or prescription medications.  Please review your pharmacy choice. Make sure the pharmacy is open so you can pick up the prescription now. If there is a problem, you may contact your provider through [employer] and have the prescription routed to another pharmacy.  Your safety is important to us . If you have drug allergies check your prescription carefully.  For the next 24 hours you can use MyChart to ask questions about today's visit, request a non-urgent call back, or ask for a work or school excuse. You will get an email with a survey after your eVisit asking about your experience. We would appreciate your feedback. I hope that your e-visit has been valuable and will aid in your recovery.  Approximately 5 minutes was spent documenting and reviewing patient's chart.

## 2023-12-27 ENCOUNTER — Ambulatory Visit (INDEPENDENT_AMBULATORY_CARE_PROVIDER_SITE_OTHER): Payer: PRIVATE HEALTH INSURANCE | Admitting: Family Medicine

## 2023-12-27 ENCOUNTER — Encounter: Payer: Self-pay | Admitting: Family Medicine

## 2023-12-27 VITALS — BP 129/87 | HR 63 | Ht 72.0 in | Wt 262.8 lb

## 2023-12-27 DIAGNOSIS — Z6835 Body mass index (BMI) 35.0-35.9, adult: Secondary | ICD-10-CM

## 2023-12-27 DIAGNOSIS — E66812 Obesity, class 2: Secondary | ICD-10-CM | POA: Diagnosis not present

## 2023-12-27 DIAGNOSIS — E6609 Other obesity due to excess calories: Secondary | ICD-10-CM | POA: Diagnosis not present

## 2023-12-27 NOTE — Progress Notes (Signed)
 Established Patient Office Visit  Subjective    Patient ID: Billy Perry, male    DOB: 05-21-1991  Age: 33 y.o. MRN: [id number]  CC:  Chief Complaint  Patient presents with   Medical Management of Chronic Issues    Weight loss     HPI TAMER BAUGHMAN presents to discuss weight management. He is unsure on what would be the best method for him to lose weight.He has started exercising and trying to cut out or down on certain foods.  Patient denies acute complaints.   Outpatient Encounter Medications as of 12/27/2023  Medication Sig   albuterol  (VENTOLIN  HFA) 108 (90 Base) MCG/ACT inhaler Inhale 2 puffs into the lungs every 6 (six) hours as needed for wheezing or shortness of breath.   cetirizine  (ZYRTEC  ALLERGY ) 10 MG tablet Take 1 tablet (10 mg total) by mouth daily.   chlorhexidine  (PERIDEX ) 0.12 % solution Use as directed 15 mLs in the mouth or throat 2 (two) times daily.   ciprofloxacin -hydrocortisone  (CIPRO  HC) OTIC suspension Place 3 drops into the left ear 2 (two) times daily.   EPINEPHrine  (EPIPEN  2-PAK) 0.3 mg/0.3 mL IJ SOAJ injection Inject 0.3 mg into the muscle as needed for up to 1 dose for anaphylaxis.   fluticasone  (FLONASE ) 50 MCG/ACT nasal spray Place 2 sprays into both nostrils daily.   hydrocortisone  2.5 % ointment Apply topically twice daily as need to red sandpapery rash.   lisinopril  (ZESTRIL ) 10 MG tablet Take 1 tablet (10 mg total) by mouth daily.   benzonatate  (TESSALON ) 100 MG capsule Take 1-2 capsules (100-200 mg total) by mouth 3 (three) times daily as needed. (Patient not taking: Reported on 05/16/2023)   olopatadine  (PATANOL) 0.1 % ophthalmic solution Place 1 drop into the right eye 2 (two) times daily. (Patient not taking: Reported on 05/16/2023)   ondansetron  (ZOFRAN -ODT) 4 MG disintegrating tablet Take 1 tablet (4 mg total) by mouth every 8 (eight) hours as needed for nausea or vomiting. (Patient not taking: Reported on 05/16/2023)   polyethylene  glycol (MIRALAX ) 17 g packet Take 17 g by mouth daily. (Patient not taking: Reported on 05/16/2023)   predniSONE  (DELTASONE ) 20 MG tablet Take 2 tablets (40 mg total) by mouth daily with breakfast. (Patient not taking: Reported on 05/16/2023)   No facility-administered encounter medications on file as of 12/27/2023.    Past Medical History:  Diagnosis Date   Asthma    Eczema    Hypertension     History reviewed. No pertinent surgical history.  Family History  Problem Relation Age of Onset   Hypertension Mother     Social History   Socioeconomic History   Marital status: Married    Spouse name: Not on file   Number of children: Not on file   Years of education: Not on file   Highest education level: 12th grade  Occupational History   Not on file  Tobacco Use   Smoking status: Never   Smokeless tobacco: Never  Vaping Use   Vaping status: Never Used  Substance and Sexual Activity   Alcohol use: Yes    Comment: socially   Drug use: No   Sexual activity: Not on file  Other Topics Concern   Not on file  Social History Narrative   Not on file   Social Drivers of Health   Financial Resource Strain: Low Risk  (02/20/2023)   Overall Financial Resource Strain (CARDIA)    Difficulty of Paying Living Expenses: Not hard at all  Food Insecurity: No Food Insecurity (02/20/2023)   Hunger Vital Sign    Worried About Running Out of Food in the Last Year: Never true    Ran Out of Food in the Last Year: Never true  Transportation Needs: No Transportation Needs (02/20/2023)   PRAPARE - Administrator, Civil Service (Medical): No    Lack of Transportation (Non-Medical): No  Physical Activity: Insufficiently Active (02/20/2023)   Exercise Vital Sign    Days of Exercise per Week: 3 days    Minutes of Exercise per Session: 30 min  Stress: No Stress Concern Present (02/20/2023)   [employer] of Occupational Health - Occupational Stress Questionnaire    Feeling of Stress  : Only a little  Social Connections: Moderately Isolated (02/20/2023)   Social Connection and Isolation Panel    Frequency of Communication with Friends and Family: Three times a week    Frequency of Social Gatherings with Friends and Family: More than three times a week    Attends Religious Services: Never    Database administrator or Organizations: No    Attends Engineer, structural: Not on file    Marital Status: Married  Catering manager Violence: Not on file    Review of Systems  All other systems reviewed and are negative.       Objective    BP 129/87   Pulse 63   Ht 6' (1.829 m)   Wt 262 lb 12.8 oz (119.2 kg)   SpO2 94%   BMI 35.64 kg/m   Physical Exam Vitals and nursing note reviewed.  Constitutional:      General: He is not in acute distress.    Appearance: He is obese.  Cardiovascular:     Rate and Rhythm: Normal rate and regular rhythm.  Pulmonary:     Effort: Pulmonary effort is normal.     Breath sounds: Normal breath sounds.  Abdominal:     Palpations: Abdomen is soft.     Tenderness: There is no abdominal tenderness.  Neurological:     General: No focal deficit present.     Mental Status: He is alert and oriented to person, place, and time.         Assessment & Plan:   Class 2 obesity due to excess calories without serious comorbidity with body mass index (BMI) of 35.0 to 35.9 in adult -     Amb ref to Medical Nutrition Therapy-MNT   The patient will adhere to a reduced calorie diet of   2400   calories per day. The patient will continue lifestyle modifications including diet and 30    minutes of exercise per week.    Return in about 2 months (around 02/26/2024) for follow up, weight management.   Tanda Raguel SQUIBB, MD

## 2024-02-10 ENCOUNTER — Ambulatory Visit: Payer: PRIVATE HEALTH INSURANCE | Admitting: Dietician

## 2024-02-20 ENCOUNTER — Encounter: Payer: Self-pay | Admitting: Family Medicine

## 2024-02-20 ENCOUNTER — Ambulatory Visit (INDEPENDENT_AMBULATORY_CARE_PROVIDER_SITE_OTHER): Payer: PRIVATE HEALTH INSURANCE | Admitting: Family Medicine

## 2024-02-20 VITALS — BP 132/83 | HR 59 | Ht 72.0 in | Wt 265.6 lb

## 2024-02-20 DIAGNOSIS — Z130 Encounter for screening for diseases of the blood and blood-forming organs and certain disorders involving the immune mechanism: Secondary | ICD-10-CM

## 2024-02-20 DIAGNOSIS — Z13228 Encounter for screening for other metabolic disorders: Secondary | ICD-10-CM

## 2024-02-20 DIAGNOSIS — Z136 Encounter for screening for cardiovascular disorders: Secondary | ICD-10-CM | POA: Diagnosis not present

## 2024-02-20 DIAGNOSIS — Z0000 Encounter for general adult medical examination without abnormal findings: Secondary | ICD-10-CM | POA: Diagnosis not present

## 2024-02-20 DIAGNOSIS — Z1329 Encounter for screening for other suspected endocrine disorder: Secondary | ICD-10-CM | POA: Diagnosis not present

## 2024-02-20 NOTE — Progress Notes (Unsigned)
 Established Patient Office Visit  Subjective    Patient ID: Billy Perry, male    DOB: 1991/03/09  Age: 33 y.o. MRN: [id number]  CC:  Chief Complaint  Patient presents with   Annual Exam    HPI [name] presents for routine annual physical exam. Patient denies acute complaints.   Outpatient Encounter Medications as of 02/20/2024  Medication Sig   albuterol  (VENTOLIN  HFA) 108 (90 Base) MCG/ACT inhaler Inhale 2 puffs into the lungs every 6 (six) hours as needed for wheezing or shortness of breath.   EPINEPHrine  (EPIPEN  2-PAK) 0.3 mg/0.3 mL IJ SOAJ injection Inject 0.3 mg into the muscle as needed for up to 1 dose for anaphylaxis.   fluticasone  (FLONASE ) 50 MCG/ACT nasal spray Place 2 sprays into both nostrils daily.   lisinopril  (ZESTRIL ) 10 MG tablet Take 1 tablet (10 mg total) by mouth daily.   benzonatate  (TESSALON ) 100 MG capsule Take 1-2 capsules (100-200 mg total) by mouth 3 (three) times daily as needed. (Patient not taking: Reported on 05/16/2023)   cetirizine  (ZYRTEC  ALLERGY ) 10 MG tablet Take 1 tablet (10 mg total) by mouth daily.   chlorhexidine  (PERIDEX ) 0.12 % solution Use as directed 15 mLs in the mouth or throat 2 (two) times daily. (Patient not taking: Reported on 02/20/2024)   ciprofloxacin -hydrocortisone  (CIPRO  HC) OTIC suspension Place 3 drops into the left ear 2 (two) times daily.   hydrocortisone  2.5 % ointment Apply topically twice daily as need to red sandpapery rash. (Patient not taking: Reported on 02/20/2024)   olopatadine  (PATANOL) 0.1 % ophthalmic solution Place 1 drop into the right eye 2 (two) times daily. (Patient not taking: Reported on 05/16/2023)   ondansetron  (ZOFRAN -ODT) 4 MG disintegrating tablet Take 1 tablet (4 mg total) by mouth every 8 (eight) hours as needed for nausea or vomiting. (Patient not taking: Reported on 05/16/2023)   polyethylene glycol (MIRALAX ) 17 g packet Take 17 g by mouth daily. (Patient not taking: Reported on  05/16/2023)   predniSONE  (DELTASONE ) 20 MG tablet Take 2 tablets (40 mg total) by mouth daily with breakfast. (Patient not taking: Reported on 05/16/2023)   No facility-administered encounter medications on file as of 02/20/2024.    Past Medical History:  Diagnosis Date   Asthma    Eczema    Hypertension     History reviewed. No pertinent surgical history.  Family History  Problem Relation Age of Onset   Hypertension Mother     Social History   Socioeconomic History   Marital status: Married    Spouse name: Not on file   Number of children: Not on file   Years of education: Not on file   Highest education level: 12th grade  Occupational History   Not on file  Tobacco Use   Smoking status: Never   Smokeless tobacco: Never  Vaping Use   Vaping status: Never Used  Substance and Sexual Activity   Alcohol use: Yes    Comment: socially   Drug use: No   Sexual activity: Not on file  Other Topics Concern   Not on file  Social History Narrative   Not on file   Social Drivers of Health   Financial Resource Strain: Low Risk  (02/19/2024)   Overall Financial Resource Strain (CARDIA)    Difficulty of Paying Living Expenses: Not hard at all  Food Insecurity: No Food Insecurity (02/19/2024)   Hunger Vital Sign    Worried About Running Out of Food in the Last Year:  Never true    Ran Out of Food in the Last Year: Never true  Transportation Needs: No Transportation Needs (02/19/2024)   PRAPARE - Administrator, Civil Service (Medical): No    Lack of Transportation (Non-Medical): No  Physical Activity: Insufficiently Active (02/19/2024)   Exercise Vital Sign    Days of Exercise per Week: 2 days    Minutes of Exercise per Session: 20 min  Stress: Stress Concern Present (02/19/2024)   [employer] of Occupational Health - Occupational Stress Questionnaire    Feeling of Stress: To some extent  Social Connections: Moderately Isolated (02/19/2024)   Social  Connection and Isolation Panel    Frequency of Communication with Friends and Family: More than three times a week    Frequency of Social Gatherings with Friends and Family: Three times a week    Attends Religious Services: Never    Active Member of Clubs or Organizations: No    Attends Engineer, structural: Not on file    Marital Status: Married  Catering manager Violence: Not on file    Review of Systems  All other systems reviewed and are negative.       Objective    BP 132/83   Pulse (!) 59   Ht 6' (1.829 m)   Wt 265 lb 9.6 oz (120.5 kg)   SpO2 95%   BMI 36.02 kg/m   Physical Exam Vitals and nursing note reviewed.  Constitutional:      General: He is not in acute distress. HENT:     Head: Normocephalic and atraumatic.     Right Ear: Tympanic membrane, ear canal and external ear normal.     Left Ear: Tympanic membrane, ear canal and external ear normal.     Nose: Nose normal.     Mouth/Throat:     Mouth: Mucous membranes are moist.     Pharynx: Oropharynx is clear.  Eyes:     Conjunctiva/sclera: Conjunctivae normal.     Pupils: Pupils are equal, round, and reactive to light.  Neck:     Thyroid: No thyromegaly.  Cardiovascular:     Rate and Rhythm: Normal rate and regular rhythm.     Heart sounds: Normal heart sounds. No murmur heard. Pulmonary:     Effort: Pulmonary effort is normal.     Breath sounds: Normal breath sounds.  Abdominal:     General: There is no distension.     Palpations: Abdomen is soft. There is no mass.     Tenderness: There is no abdominal tenderness.     Hernia: There is no hernia in the left inguinal area or right inguinal area.  Musculoskeletal:        General: Normal range of motion.     Cervical back: Normal range of motion and neck supple.     Right lower leg: No edema.     Left lower leg: No edema.  Skin:    General: Skin is warm and dry.  Neurological:     General: No focal deficit present.     Mental Status: He  is alert and oriented to person, place, and time. Mental status is at baseline.  Psychiatric:        Mood and Affect: Mood normal.        Behavior: Behavior normal.         Assessment & Plan:   Annual physical exam -     Comprehensive metabolic panel with GFR  Screening for deficiency  anemia -     CBC with Differential/Platelet  Encounter for screening for cardiovascular disorders -     Lipid panel  Screening for endocrine/metabolic/immunity disorders -     Hemoglobin A1c     No follow-ups on file.   Tanda Raguel SQUIBB, MD

## 2024-02-21 ENCOUNTER — Ambulatory Visit: Payer: Self-pay | Admitting: Family Medicine

## 2024-02-21 ENCOUNTER — Encounter: Payer: Self-pay | Admitting: Family Medicine

## 2024-02-21 LAB — HEMOGLOBIN A1C
Est. average glucose Bld gHb Est-mCnc: 94 mg/dL
Hgb A1c MFr Bld: 4.9 % (ref 4.8–5.6)

## 2024-02-21 LAB — COMPREHENSIVE METABOLIC PANEL WITH GFR
ALT: 27 IU/L (ref 0–44)
AST: 22 IU/L (ref 0–40)
Albumin: 4.5 g/dL (ref 4.1–5.1)
Alkaline Phosphatase: 79 IU/L (ref 47–123)
BUN/Creatinine Ratio: 9 (ref 9–20)
BUN: 8 mg/dL (ref 6–20)
Bilirubin Total: 0.5 mg/dL (ref 0.0–1.2)
CO2: 23 mmol/L (ref 20–29)
Calcium: 9.6 mg/dL (ref 8.7–10.2)
Chloride: 98 mmol/L (ref 96–106)
Creatinine, Ser: 0.91 mg/dL (ref 0.76–1.27)
Globulin, Total: 3 g/dL (ref 1.5–4.5)
Glucose: 84 mg/dL (ref 70–99)
Potassium: 3.9 mmol/L (ref 3.5–5.2)
Sodium: 135 mmol/L (ref 134–144)
Total Protein: 7.5 g/dL (ref 6.0–8.5)
eGFR: 114 mL/min/1.73 (ref >59)

## 2024-02-21 LAB — CBC WITH DIFFERENTIAL/PLATELET
Basophils Absolute: 0 x10E3/uL (ref 0.0–0.2)
Basos: 1 %
EOS (ABSOLUTE): 0.1 x10E3/uL (ref 0.0–0.4)
Eos: 2 %
Hematocrit: 51.3 % — ABNORMAL HIGH (ref 37.5–51.0)
Hemoglobin: 16.9 g/dL (ref 13.0–17.7)
Immature Grans (Abs): 0 x10E3/uL (ref 0.0–0.1)
Immature Granulocytes: 0 %
Lymphocytes Absolute: 1.1 x10E3/uL (ref 0.7–3.1)
Lymphs: 26 %
MCH: 29.3 pg (ref 26.6–33.0)
MCHC: 32.9 g/dL (ref 31.5–35.7)
MCV: 89 fL (ref 79–97)
Monocytes Absolute: 0.3 x10E3/uL (ref 0.1–0.9)
Monocytes: 8 %
Neutrophils Absolute: 2.7 x10E3/uL (ref 1.4–7.0)
Neutrophils: 63 %
Platelets: 253 x10E3/uL (ref 150–450)
RBC: 5.76 x10E6/uL (ref 4.14–5.80)
RDW: 12.5 % (ref 11.6–15.4)
WBC: 4.3 x10E3/uL (ref 3.4–10.8)

## 2024-02-21 LAB — LIPID PANEL
Chol/HDL Ratio: 4.8 ratio (ref 0.0–5.0)
Cholesterol, Total: 134 mg/dL (ref 100–199)
HDL: 28 mg/dL — ABNORMAL LOW (ref >39)
LDL Chol Calc (NIH): 71 mg/dL (ref 0–99)
Triglycerides: 209 mg/dL — ABNORMAL HIGH (ref 0–149)
VLDL Cholesterol Cal: 35 mg/dL (ref 5–40)

## 2024-03-23 ENCOUNTER — Encounter: Payer: Self-pay | Admitting: Dietician

## 2024-03-23 ENCOUNTER — Encounter: Payer: PRIVATE HEALTH INSURANCE | Attending: Family Medicine | Admitting: Dietician

## 2024-03-23 VITALS — Ht 72.0 in | Wt 265.5 lb

## 2024-03-23 DIAGNOSIS — E669 Obesity, unspecified: Secondary | ICD-10-CM | POA: Insufficient documentation

## 2024-03-23 NOTE — Progress Notes (Signed)
 Medical Nutrition Therapy  Appointment Start time:  64  Appointment End time:  1125  Primary concerns today: Weight Loss  Referral diagnosis: E66.812,E66.09,Z68.35 (ICD-10-CM) - Class 2 obesity due to excess calories without serious comorbidity with body mass index (BMI) of 35.0 to 35.9 in adult  Preferred learning style: No preference indicated Learning readiness: Ready   NUTRITION ASSESSMENT   Anthropometrics: Ht: 72 Wt: 265.5 lbs BMI: 36.01 kg/m2 Goal Weight: ~200 lbs   Clinical Medical Hx: Obesity, Asthma Medications: Lisinopril  Labs (02/20/2024): Triglycerides - 209, HDL - 28 Notable Signs/Symptoms: N/A Food allergy : Onion  Lifestyle & Dietary Hx Pt wife, Alan, present for appointment Pt reports history of trying to lose weight, states they have not been able to sustain weight loss. Pt reports occasionally skipping lunch 3-4 times a week, states they get busy and forget and tends to over consume at dinner. 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   Estimated daily fluid intake: >48 oz Supplements: N/A Sleep: Occasional interruptions from kids Stress / self-care: 6/10, family Current average weekly physical activity: ADLs, household chores, plays Just Dance for a few hours 3-4 times a week   24-Hr Dietary Recall First Meal: Bowl of Peaches and cream oatmeal Snack:  Second Meal: Homemade beef chili Snack:  Third Meal: Chicken and Impossible crumbles tacos, lettuce, cheese, sour cream Snack:  Beverages: Water, 12 oz soda    NUTRITION DIAGNOSIS  NB-1.1 Food and nutrition-related knowledge deficit As related to Obesity.  As evidenced by BMI of 36.01 kg/m2, .   NUTRITION INTERVENTION  Nutrition education (E-1) on the following topics:  Educated patient on the two components of energy balance: Energy in (calories), and energy out (activity). Explain the role of  negative energy balance in weight loss. Discussed options with patient to achieve a negative energy balance and how to best control energy in and energy out to accommodate their lifestyle.   Handouts Provided Include  Balanced Plate Food Assistance Resources [facility])  Learning Style & Readiness for Change Teaching method utilized: Visual & Auditory  Demonstrated degree of understanding via: Teach Back  Barriers to learning/adherence to lifestyle change: None  Goals Established by Pt Make it a point to be more active throughout the day. Play Just Dance with the kids in the evening, and after the kids go to sleep. Begin doing resistance exercise with your weights one day a week on Wednesday morning around 10:00 - 1:00 pm. Choose reduced fat options as often as you can! Work towards eating three meals a day, about 5-6 hours apart! Begin to recognize carbohydrates, proteins, and non-starchy vegetables in your food choices! Begin to build your meals using the proportions of the Balanced Plate. First, select your carb choice(s) for the meal. Make this 25% of your meal. Next, select your source of protein to pair with your carb choice(s). Make this another 25% of your meal. Finally, complete your meal with a variety of non-starchy vegetables. Make this the remaining 50% of your meal.   MONITORING & EVALUATION Dietary intake, weekly physical activity, and weight change in 2 months.  Next Steps  Patient is to follow up with RD.

## 2024-03-23 NOTE — Patient Instructions (Addendum)
 Make it a point to be more active throughout the day. Play Just Dance with the kids in the evening, and after the kids go to sleep.  Begin doing resistance exercise with your weights one day a week on Wednesday morning around 10:00 - 1:00 pm.  Choose reduced fat options as often as you can!  Work towards eating three meals a day, about 5-6 hours apart!  Begin to recognize carbohydrates, proteins, and non-starchy vegetables in your food choices!  Begin to build your meals using the proportions of the Balanced Plate. First, select your carb choice(s) for the meal. Make this 25% of your meal. Next, select your source of protein to pair with your carb choice(s). Make this another 25% of your meal. Finally, complete your meal with a variety of non-starchy vegetables. Make this the remaining 50% of your meal.

## 2024-04-04 ENCOUNTER — Other Ambulatory Visit: Payer: Self-pay | Admitting: Family Medicine

## 2024-04-06 NOTE — Telephone Encounter (Signed)
 Requested Prescriptions  Pending Prescriptions Disp Refills   lisinopril  (ZESTRIL ) 10 MG tablet [Pharmacy Med Name: LISINOPRIL  10 MG TABLET] 90 tablet 1    Sig: TAKE 1 TABLET BY MOUTH EVERY DAY     Cardiovascular:  ACE Inhibitors Passed - 04/06/2024 12:57 PM      Passed - Cr in normal range and within 180 days    Creatinine, Ser  Date Value Ref Range Status  02/20/2024 0.91 0.76 - 1.27 mg/dL Final         Passed - K in normal range and within 180 days    Potassium  Date Value Ref Range Status  02/20/2024 3.9 3.5 - 5.2 mmol/L Final         Passed - Patient is not pregnant      Passed - Last BP in normal range    BP Readings from Last 1 Encounters:  02/20/24 132/83         Passed - Valid encounter within last 6 months    Recent Outpatient Visits           1 month ago Annual physical exam   [facility], Raguel, MD   3 months ago Class 2 obesity due to excess calories without serious comorbidity with body mass index (BMI) of 35.0 to 35.9 in adult   [facility], MD   7 months ago Essential hypertension   [facility], MD   1 year ago Essential hypertension   [facility], MD   1 year ago Annual physical exam   [facility], MD

## 2024-04-19 ENCOUNTER — Telehealth: Admitting: Nurse Practitioner

## 2024-04-19 DIAGNOSIS — R350 Frequency of micturition: Secondary | ICD-10-CM | POA: Diagnosis not present

## 2024-04-19 DIAGNOSIS — R309 Painful micturition, unspecified: Secondary | ICD-10-CM

## 2024-04-19 NOTE — Progress Notes (Signed)
 Virtual Visit Consent   Billy Perry, you are scheduled for a virtual visit with a [facility] provider today. Just as with appointments in the office, your consent must be obtained to participate. Your consent will be active for this visit and any virtual visit you may have with one of our providers in the next 365 days. If you have a MyChart account, a copy of this consent can be sent to you electronically.  As this is a virtual visit, video technology does not allow for your provider to perform a traditional examination. This may limit your provider's ability to fully assess your condition. If your provider identifies any concerns that need to be evaluated in person or the need to arrange testing (such as labs, EKG, etc.), we will make arrangements to do so. Although advances in technology are sophisticated, we cannot ensure that it will always work on either your end or our end. If the connection with a video visit is poor, the visit may have to be switched to a telephone visit. With either a video or telephone visit, we are not always able to ensure that we have a secure connection.  By engaging in this virtual visit, you consent to the provision of healthcare and authorize for your insurance to be billed (if applicable) for the services provided during this visit. Depending on your insurance coverage, you may receive a charge related to this service.  I need to obtain your verbal consent now. Are you willing to proceed with your visit today? Billy Perry has provided verbal consent on 04/19/2024 for a virtual visit (video or telephone). Billy LELON Servant, NP  Date: 04/19/2024 8:39 AM   Virtual Visit via Video Note   I, Billy Perry, connected with  Billy Perry  ([id number], 1990/09/18) on 04/19/24 at  8:30 AM EST by a video-enabled telemedicine application and verified that I am speaking with the correct person using two identifiers.  Location: Patient: Virtual Visit  Location Patient: Home Provider: Virtual Visit Location Provider: Home Office   I discussed the limitations of evaluation and management by telemedicine and the availability of in person appointments. The patient expressed understanding and agreed to proceed.    History of Present Illness: Billy Perry is a 33 y.o. who identifies as a male who was assigned male at birth, and is being seen today for UTI symptoms.  Over the past few days Billy Perry has been experiencing painful urination and urinary frequency.  He denies penile discharge, fever or back pain.  He recalls being treated for a UTI a few years ago however I am unable to locate any previous office or urgent care visits where he was treated for UTI with an antibiotic.    Problems:  Patient Active Problem List   Diagnosis Date Noted   Mild intermittent asthma 09/20/2021   Seasonal and perennial allergic rhinitis 09/20/2021   Pollen-food allergy  09/20/2021   History of penicillin allergy  09/20/2021   Lumbar pain 09/19/2020    Allergies:  Allergies  Allergen Reactions   Onion Shortness Of Breath and Swelling   Amoxicillin Hives    Childhood    Medications:  Current Outpatient Medications:    albuterol  (VENTOLIN  HFA) 108 (90 Base) MCG/ACT inhaler, Inhale 2 puffs into the lungs every 6 (six) hours as needed for wheezing or shortness of breath., Disp: 8 g, Rfl: 0   benzonatate  (TESSALON ) 100 MG capsule, Take 1-2 capsules (100-200 mg total) by mouth 3 (three) times  daily as needed. (Patient not taking: Reported on 05/16/2023), Disp: 30 capsule, Rfl: 0   cetirizine  (ZYRTEC  ALLERGY ) 10 MG tablet, Take 1 tablet (10 mg total) by mouth daily., Disp: 30 tablet, Rfl: 5   chlorhexidine  (PERIDEX ) 0.12 % solution, Use as directed 15 mLs in the mouth or throat 2 (two) times daily. (Patient not taking: Reported on 02/20/2024), Disp: 120 mL, Rfl: 0   ciprofloxacin -hydrocortisone  (CIPRO  HC) OTIC suspension, Place 3 drops into the left ear 2  (two) times daily., Disp: 10 mL, Rfl: 0   EPINEPHrine  (EPIPEN  2-PAK) 0.3 mg/0.3 mL IJ SOAJ injection, Inject 0.3 mg into the muscle as needed for up to 1 dose for anaphylaxis., Disp: 1 each, Rfl: 2   fluticasone  (FLONASE ) 50 MCG/ACT nasal spray, Place 2 sprays into both nostrils daily. (Patient not taking: Reported on 03/23/2024), Disp: 16 g, Rfl: 6   hydrocortisone  2.5 % ointment, Apply topically twice daily as need to red sandpapery rash. (Patient not taking: Reported on 02/20/2024), Disp: 30 g, Rfl: 0   lisinopril  (ZESTRIL ) 10 MG tablet, TAKE 1 TABLET BY MOUTH EVERY DAY, Disp: 90 tablet, Rfl: 1   olopatadine  (PATANOL) 0.1 % ophthalmic solution, Place 1 drop into the right eye 2 (two) times daily. (Patient not taking: Reported on 05/16/2023), Disp: 5 mL, Rfl: 12   ondansetron  (ZOFRAN -ODT) 4 MG disintegrating tablet, Take 1 tablet (4 mg total) by mouth every 8 (eight) hours as needed for nausea or vomiting. (Patient not taking: Reported on 05/16/2023), Disp: 20 tablet, Rfl: 0   polyethylene glycol (MIRALAX ) 17 g packet, Take 17 g by mouth daily. (Patient not taking: Reported on 05/16/2023), Disp: 14 each, Rfl: 0   predniSONE  (DELTASONE ) 20 MG tablet, Take 2 tablets (40 mg total) by mouth daily with breakfast. (Patient not taking: Reported on 05/16/2023), Disp: 10 tablet, Rfl: 0  Observations/Objective: Patient is well-developed, well-nourished in no acute distress.  Resting comfortably at home.  Head is normocephalic, atraumatic.  No labored breathing.  Speech is clear and coherent with logical content.  Patient is alert and oriented at baseline.    Assessment and Plan: 1. Painful urination (Primary)  2. Urine frequency  Instructed to be seen at urgent care or follow-up with PCP on Monday for urinalysis and culture  Follow Up Instructions: I discussed the assessment and treatment plan with the patient. The patient was provided an opportunity to ask questions and all were answered. The  patient agreed with the plan and demonstrated an understanding of the instructions.  A copy of instructions were sent to the patient via MyChart unless otherwise noted below.    The patient was advised to call back or seek an in-person evaluation if the symptoms worsen or if the condition fails to improve as anticipated.    Regina Ganci W Latravia [place], NP

## 2024-04-19 NOTE — Patient Instructions (Signed)
 Billy Perry, thank you for joining [name], NP for today's virtual visit.  While this provider is not your primary care provider (PCP), if your PCP is located in our provider database this encounter information will be shared with them immediately following your visit.   A [name] account gives you access to today's visit and all your visits, tests, and labs performed at [facility]  click here if you don't have a [name] account or go to [email or web address]  Consent: (Patient) Billy Perry provided verbal consent for this virtual visit at the beginning of the encounter.  Current Medications:  Current Outpatient Medications:    albuterol  (VENTOLIN  HFA) 108 (90 Base) MCG/ACT inhaler, Inhale 2 puffs into the lungs every 6 (six) hours as needed for wheezing or shortness of breath., Disp: 8 g, Rfl: 0   benzonatate  (TESSALON ) 100 MG capsule, Take 1-2 capsules (100-200 mg total) by mouth 3 (three) times daily as needed. (Patient not taking: Reported on 05/16/2023), Disp: 30 capsule, Rfl: 0   cetirizine  (ZYRTEC  ALLERGY ) 10 MG tablet, Take 1 tablet (10 mg total) by mouth daily., Disp: 30 tablet, Rfl: 5   chlorhexidine  (PERIDEX ) 0.12 % solution, Use as directed 15 mLs in the mouth or throat 2 (two) times daily. (Patient not taking: Reported on 02/20/2024), Disp: 120 mL, Rfl: 0   ciprofloxacin -hydrocortisone  (CIPRO  HC) OTIC suspension, Place 3 drops into the left ear 2 (two) times daily., Disp: 10 mL, Rfl: 0   EPINEPHrine  (EPIPEN  2-PAK) 0.3 mg/0.3 mL IJ SOAJ injection, Inject 0.3 mg into the muscle as needed for up to 1 dose for anaphylaxis., Disp: 1 each, Rfl: 2   fluticasone  (FLONASE ) 50 MCG/ACT nasal spray, Place 2 sprays into both nostrils daily. (Patient not taking: Reported on 03/23/2024), Disp: 16 g, Rfl: 6   hydrocortisone  2.5 % ointment, Apply topically twice daily as need to red sandpapery rash. (Patient not taking: Reported on  02/20/2024), Disp: 30 g, Rfl: 0   lisinopril  (ZESTRIL ) 10 MG tablet, TAKE 1 TABLET BY MOUTH EVERY DAY, Disp: 90 tablet, Rfl: 1   olopatadine  (PATANOL) 0.1 % ophthalmic solution, Place 1 drop into the right eye 2 (two) times daily. (Patient not taking: Reported on 05/16/2023), Disp: 5 mL, Rfl: 12   ondansetron  (ZOFRAN -ODT) 4 MG disintegrating tablet, Take 1 tablet (4 mg total) by mouth every 8 (eight) hours as needed for nausea or vomiting. (Patient not taking: Reported on 05/16/2023), Disp: 20 tablet, Rfl: 0   polyethylene glycol (MIRALAX ) 17 g packet, Take 17 g by mouth daily. (Patient not taking: Reported on 05/16/2023), Disp: 14 each, Rfl: 0   predniSONE  (DELTASONE ) 20 MG tablet, Take 2 tablets (40 mg total) by mouth daily with breakfast. (Patient not taking: Reported on 05/16/2023), Disp: 10 tablet, Rfl: 0   Medications ordered in this encounter:  No orders of the defined types were placed in this encounter.    *If you need refills on other medications prior to your next appointment, please contact your pharmacy*  Follow-Up: Call back or seek an in-person evaluation if the symptoms worsen or if the condition fails to improve as anticipated.  [name] [phone]  Other Instructions Instructed to be seen at urgent care or follow-up with PCP on Monday for urinalysis and culture   If you have been instructed to have an in-person evaluation today at a local Urgent Care facility, please use the link below. It will take you to a list of  all of our available [facility], including address, phone number and hours of operation. Please do not delay care.  [name]  If you or a family member do not have a primary care provider, use the link below to schedule a visit and establish care. When you choose a [facility] primary care physician or advanced practice provider, you gain a long-term partner in health. Find a Primary Care Provider  Learn more  about [facility] in-office and virtual care options: [name]

## 2024-06-29 ENCOUNTER — Encounter: Admitting: Dietician

## 2024-07-06 ENCOUNTER — Encounter: Admitting: Dietician
